# Patient Record
Sex: Male | Born: 1980
Health system: Southern US, Community
[De-identification: ages and names within clinical notes are randomized; demographics above are authoritative.]

## PROBLEM LIST (undated history)

## (undated) DIAGNOSIS — E7800 Pure hypercholesterolemia, unspecified: Secondary | ICD-10-CM

## (undated) DIAGNOSIS — I10 Essential (primary) hypertension: Secondary | ICD-10-CM

## (undated) HISTORY — DX: Pure hypercholesterolemia, unspecified: E78.00

## (undated) HISTORY — PX: TONSILLECTOMY AND ADENOIDECTOMY: SHX28

## (undated) HISTORY — DX: Essential (primary) hypertension: I10

---

## 2009-08-06 HISTORY — PX: LASIK: SHX215

## 2016-01-27 ENCOUNTER — Encounter: Payer: Self-pay | Admitting: Family Medicine

## 2016-06-25 ENCOUNTER — Encounter: Payer: Self-pay | Admitting: Family Medicine

## 2016-07-26 ENCOUNTER — Encounter: Payer: Self-pay | Admitting: Neurology

## 2016-07-26 ENCOUNTER — Ambulatory Visit (INDEPENDENT_AMBULATORY_CARE_PROVIDER_SITE_OTHER): Payer: Managed Care, Other (non HMO) | Admitting: Neurology

## 2016-07-26 VITALS — BP 142/90 | HR 81 | Ht 67.0 in | Wt 191.6 lb

## 2016-07-26 DIAGNOSIS — R42 Dizziness and giddiness: Secondary | ICD-10-CM

## 2016-07-26 DIAGNOSIS — I10 Essential (primary) hypertension: Secondary | ICD-10-CM | POA: Diagnosis not present

## 2016-07-26 MED ORDER — ONDANSETRON 4 MG PO TBDP: ORAL_TABLET | ORAL | 6 refills | Status: DC

## 2016-07-26 NOTE — Patient Instructions (Addendum)
Remember to drink plenty of fluid, eat healthy meals and do not skip any meals. Try to eat protein with a every meal and eat a healthy snack such as fruit or nuts in between meals. Try to keep a regular sleep-wake schedule and try to exercise daily, particularly in the form of walking, 20-30 minutes a day, if you can.    Vestibular therapy, will review MRI brain, zofran as needed  Will call to discuss follow up  Our phone number is [phone]. We also have an after hours call service for urgent matters and there is a physician on-call for urgent questions. For any emergencies you know to call 911 or go to the nearest emergency room  Ondansetron oral dissolving tablet What is this medicine? ONDANSETRON (on DAN se tron) is used to treat nausea and vomiting caused by chemotherapy. It is also used to prevent or treat nausea and vomiting after surgery. This medicine may be used for other purposes; ask your health care provider or pharmacist if you have questions. COMMON BRAND NAME(S): Zofran ODT What should I tell my health care provider before I take this medicine? They need to know if you have any of these conditions: -heart disease -history of irregular heartbeat -liver disease -low levels of magnesium or potassium in the blood -an unusual or allergic reaction to ondansetron, granisetron, other medicines, foods, dyes, or preservatives -pregnant or trying to get pregnant -breast-feeding How should I use this medicine? These tablets are made to dissolve in the mouth. Do not try to push the tablet through the foil backing. With dry hands, peel away the foil backing and gently remove the tablet. Place the tablet in the mouth and allow it to dissolve, then swallow. While you may take these tablets with water, it is not necessary to do so. Talk to your pediatrician regarding the use of this medicine in children. Special care may be needed. Overdosage: If you think you have taken too much of this  medicine contact a poison control center or emergency room at once. NOTE: This medicine is only for you. Do not share this medicine with others. What if I miss a dose? If you miss a dose, take it as soon as you can. If it is almost time for your next dose, take only that dose. Do not take double or extra doses. What may interact with this medicine? Do not take this medicine with any of the following medications: -apomorphine -certain medicines for fungal infections like fluconazole, itraconazole, ketoconazole, posaconazole, voriconazole -cisapride -dofetilide -dronedarone -pimozide -thioridazine -ziprasidone This medicine may also interact with the following medications: -carbamazepine -certain medicines for depression, anxiety, or psychotic disturbances -fentanyl -linezolid -MAOIs like Carbex, Eldepryl, Marplan, Nardil, and Parnate -methylene blue (injected into a vein) -other medicines that prolong the QT interval (cause an abnormal heart rhythm) -phenytoin -rifampicin -tramadol This list may not describe all possible interactions. Give your health care provider a list of all the medicines, herbs, non-prescription drugs, or dietary supplements you use. Also tell them if you smoke, drink alcohol, or use illegal drugs. Some items may interact with your medicine. What should I watch for while using this medicine? Check with your doctor or health care professional as soon as you can if you have any sign of an allergic reaction. What side effects may I notice from receiving this medicine? Side effects that you should report to your doctor or health care professional as soon as possible: -allergic reactions like skin rash, itching or hives, swelling  of the face, lips, or tongue -breathing problems -confusion -dizziness -fast or irregular heartbeat -feeling faint or lightheaded, falls -fever and chills -loss of balance or coordination -seizures -sweating -swelling of the hands and  feet -tightness in the chest -tremors -unusually weak or tired Side effects that usually do not require medical attention (report to your doctor or health care professional if they continue or are bothersome): -constipation or diarrhea -headache This list may not describe all possible side effects. Call your doctor for medical advice about side effects. You may report side effects to FDA at 1-800-FDA-1088. Where should I keep my medicine? Keep out of the reach of children. Store between 2 and 30 degrees C (36 and 86 degrees F). Throw away any unused medicine after the expiration date. NOTE: This sheet is a summary. It may not cover all possible information. If you have questions about this medicine, talk to your doctor, pharmacist, or health care provider.  2017 Elsevier/Gold Standard (2013-04-29 16:21:52)

## 2016-07-26 NOTE — Progress Notes (Signed)
[facility]    Provider:  Dr Lucia GaskinsAhern Referring Provider: [name], Aaron, MD Primary Care Physician:  [name], AARON, MD  CC:  Dizziness  HPI:  Justin Rush is a 35 y.o. male here as a referral from Dr. Kateri PlummerMorrow for dizziness. Past medical history hypertension, high cholesterol. He has constant, continuous dizziness. He was treated with steroids with thoughts it may be labyrinthitis and that did not help. 7-8 weeks ago he started feeling fatigued. Several weeks later the dizziness started and has been continuous for weeks, better in the last week, mornings are better but evenings are worse. Room is not spinning, feels like he is watching an amateur video or walking on a trampoline, when he looks everything is a second behind, or on a boat. He feels like his energy level is low. No CP, SOB, palpitations but he does feel winded and feels his heart rate is increased. He had headaches, no hx of migraines, the headaches are pressure in the back of his head and around his eyes, no light sensitivity, he has ear ringing, no nausea or vomiting, he does get a buzzing in his ears. He feels like he has a blown speaker in his ear. Nothing focal, no paresthesias or focal weakness. No triggers, no previous illnesses, no cold nothing to incite these events. Better in the morning, worse at night. He is very tired, falling asleep at lunch. No snoring. He has had a medical workup an an ENT workup. There were no changes in medication. Here with wife who also provides information,  Reviewed notes, labs and imaging from outside physicians, which showed: Patient is on prednisone 10 mg taper starting at 58 and tapering over 6 days, amlodipine, hydrochlorothiazide, atorvastatin, and topical steroids. The patient was seen at [facility]. He describes distortion of hearing and occasional tinnitus probably related to past noise exposure. He describes a sense of looseness while standing, going back maybe 7 weeks.  Worse later in the day. He has increased fatigue. He feels like his heart rate elevates more rapidly than it used to and he feels winded. He cannot run because everything was up and down. He feels dizzy. He does not describe any true orthostasis or syncope. No cardiac arrhythmias. He is not diabetic. No history of head trauma or whiplash. Sometimes turning his head from side to side causes some symptoms. He was feels some palpation in his ears and behind his eyes he has a diffuse mild headache late in the day. No change in vision or hearing with the dizziness. He does notice that both ears seem a little distorted with loud noise and he has occasional ringing. He was diagnosed dizziness without vertigo. He describes some tachycardia and dyspnea. He has some mild motion related symptoms without true vertigo.  Reviewed testing, pure-tone audiometry revealed excellent hearing in both ears with excellent discrimination. Tympanograms normally side. X Hallpike testing entirely normal. Orthostatic pulse and blood pressure checks normal and without any symptoms with change of position.    BUN 15, creatinine 1.02, TSH 1.3 for 06/25/2016  Review of Systems: Patient complains of symptoms per HPI as well as the following symptoms: Fatigue, headache, dizziness, cramps, allergies, decreased energy, rash. Pertinent negatives per HPI. All others negative.   Social History   Social History  . Marital status: Married    Spouse name: Melissa  . Number of children: 2  . Years of education: BS   Occupational History  . [employer]    Social  History Main Topics  . Smoking status: Never Smoker  . Smokeless tobacco: Never Used  . Alcohol use Yes     Comment: 1 drink per week  . Drug use: No  . Sexual activity: Not on file   Other Topics Concern  . Not on file   Social History Narrative   Lives with spouse 2 children   Caffeine use: none    Family History  Problem Relation Age of Onset  . Stroke  Neg Hx     Past Medical History:  Diagnosis Date  . High cholesterol   . Hypertension     Past Surgical History:  Procedure Laterality Date  . LASIK  2011  . TONSILLECTOMY AND ADENOIDECTOMY  1985/86    Current Outpatient Prescriptions  Medication Sig Dispense Refill  . amLODipine (NORVASC) 5 MG tablet Take 5 mg by mouth daily.    Marland Kitchen atorvastatin (LIPITOR) 10 MG tablet Take 10 mg by mouth daily.    . Cholecalciferol (VITAMIN D3 PO) Take 2,000 Units by mouth daily.    . hydrochlorothiazide (HYDRODIURIL) 25 MG tablet Take 25 mg by mouth daily.    . Multiple Vitamins-Minerals (MULTIVITAMIN MEN PO) Take 1 tablet by mouth daily.    . ondansetron (ZOFRAN ODT) 4 MG disintegrating tablet Take 1 tablet (4 mg total) by mouth every 6 (six) hours as needed 30 tablet 6   No current facility-administered medications for this visit.     Allergies as of 07/26/2016 - never reviewed  Allergen Reaction Noted  . Penicillin g Other (See Comments) 07/05/2016    Vitals: BP (!) 142/90 (BP Location: Right Arm, Patient Position: Sitting, Cuff Size: Normal)   Pulse 81   Ht 5\' 7"  (1.702 m)   Wt 191 lb 9.6 oz (86.9 kg)   BMI 30.01 kg/m  Last Weight:  Wt Readings from Last 1 Encounters:  07/26/16 191 lb 9.6 oz (86.9 kg)   Last Height:   Ht Readings from Last 1 Encounters:  07/26/16 5\' 7"  (1.702 m)   Physical exam: Exam: Gen: NAD, conversant, well nourised, well groomed                     CV: RRR, no MRG. No Carotid Bruits. No peripheral edema, warm, nontender Eyes: Conjunctivae clear without exudates or hemorrhage  Neuro: Detailed Neurologic Exam  Speech:    Speech is normal; fluent and spontaneous with normal comprehension.  Cognition:    The patient is oriented to person, place, and time;     recent and remote memory intact;     language fluent;     normal attention, concentration,     fund of knowledge Cranial Nerves:    The pupils are equal, round, and reactive to light. The  fundi are normal and spontaneous venous pulsations are present. Visual fields are full to finger confrontation. Extraocular movements are intact. Trigeminal sensation is intact and the muscles of mastication are normal. The face is symmetric. The palate elevates in the midline. Hearing intact. Voice is normal. Shoulder shrug is normal. The tongue has normal motion without fasciculations.   Coordination:    Normal finger to nose and heel to shin. Normal rapid alternating movements.   Gait:    Heel-toe and tandem gait are normal.   Motor Observation:    No asymmetry, no atrophy, and no involuntary movements noted. Tone:    Normal muscle tone.    Posture:    Posture is normal. normal erect  Strength:    Strength is V/V in the upper and lower limbs.      Sensation: intact to LT     Reflex Exam:  DTR's:    Deep tendon reflexes in the upper and lower extremities are normal bilaterally.   Toes:    The toes are downgoing bilaterally.   Clonus:    Clonus is absent.       Assessment/Plan:  35 year old with approximately 8 weeks of dizziness without vertigo, continuous. He was already evaluated by ENT without etiology. pure-tone audiometry revealed excellent hearing in both ears with excellent discrimination. Tympanograms normally side. X Hallpike testing entirely normal. Orthostatic pulse and blood pressure checks normal, without any symptoms with change of position. Neurologic exam is normal. He is pending an MRI brain, is having headaches, may consider vestibular migraines as well.   - Please follow up with primary care ensure that an appropriate cardiac workup, as clinically warranted, has been completed. - Vestibular therapy. - MRI of the brain w/wo contrast - zofran for dizziness, could also try meclizine or compazine - he had a thorough workup with Dr. Kateri PlummerMorrow, he requests a lyme test. I think this would be an usual presentation for lyme but will order.  Discussed: To prevent  or relieve headaches, try the following: Cool Compress. Lie down and place a cool compress on your head.  Avoid headache triggers. If certain foods or odors seem to have triggered your migraines in the past, avoid them. A headache diary might help you identify triggers.  Include physical activity in your daily routine. Try a daily walk or other moderate aerobic exercise.  Manage stress. Find healthy ways to cope with the stressors, such as delegating tasks on your to-do list.  Practice relaxation techniques. Try deep breathing, yoga, massage and visualization.  Eat regularly. Eating regularly scheduled meals and maintaining a healthy diet might help prevent headaches. Also, drink plenty of fluids.  Follow a regular sleep schedule. Sleep deprivation might contribute to headaches Consider biofeedback. With this mind-body technique, you learn to control certain bodily functions - such as muscle tension, heart rate and blood pressure - to prevent headaches or reduce headache pain.    Proceed to emergency room if you experience new or worsening symptoms or symptoms do not resolve, if you have new neurologic symptoms or if headache is severe, or for any concerning symptom.  Cc: Dr Grafton FolkMorrow   Junior Kenedy, MD  [facility] [address] [place], [place] [id number]  Phone [phone] Fax [phone]

## 2016-07-27 ENCOUNTER — Other Ambulatory Visit: Payer: Self-pay | Admitting: Family Medicine

## 2016-07-27 DIAGNOSIS — R5383 Other fatigue: Secondary | ICD-10-CM

## 2016-07-27 DIAGNOSIS — R42 Dizziness and giddiness: Secondary | ICD-10-CM

## 2016-07-27 LAB — B. BURGDORFI ANTIBODIES

## 2016-08-01 ENCOUNTER — Telehealth: Payer: Self-pay | Admitting: *Deleted

## 2016-08-01 NOTE — Telephone Encounter (Signed)
Called and spoke to pt about lab results per AA,MD note. He verbalized understanding.  He verified [facility] is [facility]. Advised we will call with results as soon as those come back.

## 2016-08-01 NOTE — Telephone Encounter (Signed)
-----   Message from Anson FretAntonia B Ahern, MD sent at 07/28/2016 12:02 PM EST ----- Lyme normal thanks

## 2016-08-07 ENCOUNTER — Ambulatory Visit
Admission: RE | Admit: 2016-08-07 | Discharge: 2016-08-07 | Disposition: A | Discharge status: Home / Self Care | Payer: Self-pay | Source: Ambulatory Visit | Attending: Family Medicine | Admitting: Family Medicine

## 2016-08-07 DIAGNOSIS — R5383 Other fatigue: Secondary | ICD-10-CM

## 2016-08-07 DIAGNOSIS — R42 Dizziness and giddiness: Secondary | ICD-10-CM

## 2016-08-07 MED ORDER — GADOBENATE DIMEGLUMINE 529 MG/ML IV SOLN
17.0000 mL | Freq: Once | INTRAVENOUS | Status: AC | PRN
  Administered 2016-08-07: 17 mL via INTRAVENOUS

## 2016-08-09 NOTE — Telephone Encounter (Signed)
MRI normal. Incidental Retention cyst RIGHT maxillary sinus which is often times asymptomatic but if he has sinus pressure or pain he should follow up with pcp or ent for evaluation. Thanks.

## 2016-08-09 NOTE — Telephone Encounter (Signed)
Pt called request MRI results °

## 2016-08-09 NOTE — Telephone Encounter (Signed)
Called and spoke to pt about MRI results per AA,MD note. He verbalized understanding. He has first physical therapy appt tomorrow for initial consult. He will call back if he has further questions/concerns.

## 2016-08-09 NOTE — Telephone Encounter (Signed)
Called and spoke to pt. Advised results not ready yet. Can take up to a week to come back. We will call once ready. He verbalized understanding.

## 2016-08-10 ENCOUNTER — Ambulatory Visit
Payer: Managed Care, Other (non HMO) | Attending: Neurology | Admitting: Rehabilitative and Restorative Service Providers"

## 2016-08-10 DIAGNOSIS — R42 Dizziness and giddiness: Secondary | ICD-10-CM

## 2016-08-10 NOTE — Therapy (Signed)
[facility] [address] [place], [place], [address] Phone: [phone]   Fax:  [phone]  Physical Therapy Evaluation  Patient Details  Name: Justin Rush MRN: [id number] Date of Birth: 15-Feb-1981 Referring Provider: Anda Kraft, MD  Encounter Date: 08/10/2016      PT End of Session - 08/10/16 1232    Visit Number 1   Number of Visits 8   Date for PT Re-Evaluation 10/09/16   Authorization Type [employer]   PT Start Time 0845   PT Stop Time 0932   PT Time Calculation (min) 47 min   Activity Tolerance Patient tolerated treatment well   Behavior During Therapy [facility] for tasks assessed/performed      Past Medical History:  Diagnosis Date  . High cholesterol   . Hypertension     Past Surgical History:  Procedure Laterality Date  . LASIK  2011  . TONSILLECTOMY AND ADENOIDECTOMY  1985/86    There were no vitals filed for this visit.       Subjective Assessment - 08/10/16 0928    Subjective The patient began with severe fatigue 2 months ago x 2 weeks followed by onset of dizziness.  He denies sensation of room spinning, and describes visual movement stating "you turn and the screen moves with you".   Symptoms are improved with sitting still and aggravated by physical activity/movement.  He notes intermittent headaches, not severe in nature.  Symptoms are worse later in the day.  Fatigue continues, worse in the afternoon.  He notes a buzzing sensation when exposed to loud noise and an aural fullness that is intermittent in nature.  No h/o vertigo or visual deficits.  "I can hear my hearbeat in my ears."     Patient Stated Goals "Hoping to get rid of this" and get back to normal.    Currently in Pain? No/denies            [facility] PT Assessment - 08/10/16 0935      Assessment   Medical Diagnosis Dizziness   Referring Provider Anda Kraft, MD   Onset Date/Surgical Date --  05/2016   Prior Therapy none     Precautions   Precautions None     Restrictions   Weight Bearing Restrictions No     Balance Screen   Has the patient fallen in the past 6 months No   Has the patient had a decrease in activity level because of a fear of falling?  Yes  due to symptoms   Is the patient reluctant to leave their home because of a fear of falling?  No     Home Tourist information centre manager residence     Prior Function   Level of Independence Independent   Vocation Full time employment   Vocation Requirements seated at desk, meetings (noting head motion difficult)   Leisure working out (unable at this time due to symptoms).     Observation/Other Assessments   Focus on Therapeutic Outcomes (FOTO)  78%   Other Surveys  Other Surveys   Dizziness Handicap Inventory [facility])  30%     Sensation   Light Touch Appears Intact     Posture/Postural Control   Posture/Postural Control No significant limitations            Vestibular Assessment - 08/10/16 0938      Vestibular Assessment   General Observation Patient ambulates independently into clinic     Symptom Behavior   Type of  Dizziness Oscillopsia   Frequency of Dizziness daily   Duration of Dizziness notes every time he walks or moves   Aggravating Factors Turning head quickly;Activity in general  walking, and worse with fatigue/afternoons   Relieving Factors Head stationary     Occulomotor Exam   Occulomotor Alignment Normal   Spontaneous Absent   Gaze-induced Absent   Head shaking Horizontal Absent   Smooth Pursuits Intact   Saccades Intact   Comment Observed eyes in room light and with frenzel lenses donned.  Patient did not have visible nystagmus during evaluation.     Vestibulo-Occular Reflex   VOR 1 Head Only (x 1 viewing) Experiences mild sensation of visual movement with self regulated head motion.  Head impulse test is positive to both sides for refixation saccade   VOR Cancellation Normal     Visual Acuity    Static line 10 with both eyes open   Dynamic line 3 with 7 line difference indicating bilateral defect in VOR     Other Tests   Tragal Tragal pressure testing with frenzels donned negative   Hyperventilation negative for nystagmus     Positional Testing   Dix-Hallpike --  deferred due to has been done and no indication for BPPV     Production designer, theatre/television/film organization testing is WNLs compared to age/height normative values.  He had below normal performance on trials 1-2 of condition 4, and then corrected for support surface movement.                 Vestibular Treatment/Exercise - 08/10/16 2239      Vestibular Treatment/Exercise   Vestibular Treatment Provided Gaze   Gaze Exercises X1 Viewing Horizontal;X1 Viewing Vertical     X1 Viewing Horizontal   Foot Position standing feet apart   Comments x 30 seconds with demonstration and verbal cues on speed, range of motion, and goal of exercise     X1 Viewing Vertical   Foot Position repeated vertical gaze adaptation with feet apart as above.               PT Education - 08/10/16 1016    Education provided Yes   Education Details HEP: VOR x 1 gaze adaptation standing feet apart   Person(s) Educated Patient   Methods Explanation;Demonstration;Handout   Comprehension Verbalized understanding;Returned demonstration          PT Short Term Goals - 08/10/16 2241      PT SHORT TERM GOAL #1   Title STGs=LTGs           PT Long Term Goals - 08/10/16 2241      PT LONG TERM GOAL #1   Title The patient will be independent with gaze adaptation HEP to address VOR deficits.   Baseline Target date 09/10/2016   Time 4   Period Weeks     PT LONG TERM GOAL #2   Title The patient will reduce DHI from 30% to < or equal to 18% to demo dec'd self perception of dizziness.   Baseline New target date 09/10/2016   Time 4   Period Weeks     PT LONG TERM GOAL #3    Title The patient will return to modified community wellness program.   Baseline New target date 09/10/2016   Time 4   Period Weeks               Plan - 08/10/16 2249  Clinical Impression Statement The patient is a [age] year old male presenting to OP PT with 2+ month h/o fatigue and visual oscillopsia. He presents with positive head impulse test bilaterally and 7 line difference on static versus dynamic visual acuity indicating significant VOR impairment bilaterally.   In addition to oscillopsia, he is also experiencing audiologic symptoms of sensitivity to drums at church (creates a tunneling sensation worse in L ear), hearing his own heartbeat in his ears.  The patient may benefit from further medical testing due to audiologic symtpoms.   Rehab Potential Good   PT Frequency 1x / week   PT Duration 4 weeks   PT Treatment/Interventions ADLs/Self Care Home Management;Therapeutic activities;Therapeutic exercise;Balance training;Neuromuscular re-education;Patient/family education;Gait training;Vestibular   PT Next Visit Plan Check progress on gaze adaptation exercises.  Discuss return to gym with PT modifying current plan to improve tolerance.   Consulted and Agree with Plan of Care Patient      Patient will benefit from skilled therapeutic intervention in order to improve the following deficits and impairments:  Decreased activity tolerance, Dizziness, Impaired vision/preception  Visit Diagnosis: Dizziness and giddiness     Problem List Patient Active Problem List   Diagnosis Date Noted  . Dizziness 07/26/2016  . HTN (hypertension) 07/26/2016    Yer Castello 08/10/2016, 11:02 PM  [facility] [address] [place], [place], [address] Phone: [phone]   Fax:  [phone]  Name: Joanie CoddingtonDaniel Brisco MRN: [id number] Date of Birth: 1981/07/17

## 2016-08-10 NOTE — Patient Instructions (Signed)
Gaze Stabilization - Tip Card  1.Target must remain in focus, not blurry, and appear stationary while head is in motion. 2.Perform exercises with small head movements (45 to either side of midline). 3.Increase speed of head motion so long as target is in focus. 4.If you wear eyeglasses, be sure you can see target through lens (therapist will give specific instructions for bifocal / progressive lenses). 5.These exercises may provoke dizziness or nausea. Work through these symptoms. If too dizzy, slow head movement slightly. Rest between each exercise. 6.Exercises demand concentration; avoid distractions. 7.For safety, perform standing exercises close to a counter, wall, corner, or next to someone.  Copyright  VHI. All rights reserved.   Gaze Stabilization - Standing Feet Apart   Feet shoulder width apart, keeping eyes on target on wall 3 feet away, tilt head down slightly and move head side to side for 30 seconds. Repeat while moving head up and down for 30 seconds. *Work up to tolerating 60 seconds, as able. Do 3 sessions per day.   Copyright  VHI. All rights reserved.    

## 2016-08-24 ENCOUNTER — Ambulatory Visit: Payer: Managed Care, Other (non HMO) | Admitting: Rehabilitative and Restorative Service Providers"

## 2016-08-24 DIAGNOSIS — R42 Dizziness and giddiness: Secondary | ICD-10-CM

## 2016-08-24 NOTE — Therapy (Signed)
[facility] [address] [place], [place], [address] Phone: [phone]   Fax:  [phone]  Physical Therapy Treatment  Patient Details  Name: Justin Rush MRN: [id number] Date of Birth: 12-18-1980 Referring Provider: Heide Spark, MD  Encounter Date: 08/24/2016      PT End of Session - 08/24/16 0945    Visit Number 2   Number of Visits 8   Date for PT Re-Evaluation 10/09/16   Authorization Type [employer]   PT Start Time [id number]   PT Stop Time 0930   PT Time Calculation (min) 40 min   Activity Tolerance Patient tolerated treatment well   Behavior During Therapy [facility] for tasks assessed/performed      Past Medical History:  Diagnosis Date  . High cholesterol   . Hypertension     Past Surgical History:  Procedure Laterality Date  . LASIK  2011  . TONSILLECTOMY AND ADENOIDECTOMY  1985/86    There were no vitals filed for this visit.      Subjective Assessment - 08/24/16 0850    Subjective The exercises "feel awkward".  He notes an improvement in symptoms reporting no longer constant.  He still notes symptoms every day with sporadic symptoms not associated with any specific movement.  He hears heartbeat in his ear when quiet and still notes "speaker blown" sensation in his left ear (it is tone dependent).     Patient Stated Goals "Hoping to get rid of this" and get back to normal.    Currently in Pain? No/denies                         [facility] Adult PT Treatment/Exercise - 08/24/16 1001      Self-Care   Self-Care Other Self-Care Comments   Other Self-Care Comments  Discussed modification of jogging routine to interval training; also discussed variables for safe ways to progress exercises, cardio routine and strengthening activities.  Provided guidance via written cues on HEP.          Vestibular Treatment/Exercise - 08/24/16 0958      Vestibular Treatment/Exercise   Vestibular Treatment  Provided Habituation;Gaze   Habituation Exercises Standing Horizontal Head Turns   Gaze Exercises X1 Viewing Horizontal;X1 Viewing Vertical     Standing Horizontal Head Turns   Number of Reps  10   Symptom Description  Initially began with 10 reps without visual fixation while standingon compliant surfaces.  Patient noted moderate to severe symptom provocation.  Therefore, reduced speed of head motion and repetitions to 5x for HEP.  Provokes mild to moderate symtpoms.      X1 Viewing Horizontal   Foot Position standing feet apart and progressed to standing partial heel toe and then tandem.   Comments Performed x 45 seconds with feet apart and 30 seconds with tandem foot position.     X1 Viewing Vertical   Foot Position standing feet apart   Comments Progressed to 45 seconds with less symptoms than in horizontal plane.               PT Education - 08/24/16 0944    Education provided Yes   Education Details HEP: VOR x 1 viewing feet apart for speed, VOR x 1 viewing tandem stance, foam stand with head motion, and walking program   Person(s) Educated Patient   Methods Explanation;Demonstration;Handout   Comprehension Verbalized understanding;Returned demonstration          PT Short Term Goals -  08/10/16 2241      PT SHORT TERM GOAL #1   Title STGs=LTGs           PT Long Term Goals - 08/24/16 0946      PT LONG TERM GOAL #1   Title The patient will be independent with gaze adaptation HEP to address VOR deficits.   Baseline Met on 08/24/2016   Time 4   Period Weeks   Status Achieved     PT LONG TERM GOAL #2   Title The patient will reduce DHI from 30% to < or equal to 18% to demo dec'd self perception of dizziness.   Baseline New target date 09/10/2016   Time 4   Period Weeks     PT LONG TERM GOAL #3   Title The patient will return to modified community wellness program.   Baseline New target date 09/10/2016   Time 4   Period Weeks               Plan -  08/24/16 7412    Clinical Impression Statement The patient is progressing with symptoms overall noting reduced frequency of symptoms.  PT session focused on modifying current home program in order to challenge gaze exercises, head motion for habituation, and progressing wellness routine for improving tolerance to activities.    PT Treatment/Interventions ADLs/Self Care Home Management;Therapeutic activities;Therapeutic exercise;Balance training;Neuromuscular re-education;Patient/family education;Gait training;Vestibular   PT Next Visit Plan Check HEP, discuss progression of wellness routine, long term goal check.   Consulted and Agree with Plan of Care Patient      Patient will benefit from skilled therapeutic intervention in order to improve the following deficits and impairments:  Decreased activity tolerance, Dizziness, Impaired vision/preception  Visit Diagnosis: Dizziness and giddiness     Problem List Patient Active Problem List   Diagnosis Date Noted  . Dizziness 07/26/2016  . HTN (hypertension) 07/26/2016    Arbadella Kimbler, PT 08/24/2016, 10:02 AM  [facility] [address] [place], [place], [address] Phone: [phone]   Fax:  [phone]  Name: Justin Rush MRN: [id number] Date of Birth: 26-Mar-1981

## 2016-08-24 NOTE — Patient Instructions (Signed)
Gaze Stabilization - Tip Card  1.Target must remain in focus, not blurry, and appear stationary while head is in motion. 2.Perform exercises with small head movements (45 to either side of midline). 3.Increase speed of head motion so long as target is in focus. 4.If you wear eyeglasses, be sure you can see target through lens (therapist will give specific instructions for bifocal / progressive lenses). 5.These exercises may provoke dizziness or nausea. Work through these symptoms. If too dizzy, slow head movement slightly. Rest between each exercise. 6.Exercises demand concentration; avoid distractions. 7.For safety, perform standing exercises close to a counter, wall, corner, or next to someone.  Copyright  VHI. All rights reserved.   Gaze Stabilization - Standing Feet Apart   Feet shoulder width apart, keeping eyes on target on wall 3 feet away, tilt head down slightly and move head side to side for 45-60 seconds. Repeat while moving head up and down for 45-60 seconds. Do 2-3 sessions per day.   Copyright  VHI. All rights reserved.   Gaze Stabilization: Standing Feet Heel-Toe "Tandem"    Feet in full heel-toe position, keeping eyes on target on wall __3__ feet away, tilt head down 15-30 and move head side to side for __30__ seconds. Switch feet and repeat side to side for 30 seconds. Do __2__ sessions per day.  Copyright  VHI. All rights reserved.   Feet Together (Compliant Surface) Head Motion - Eyes Open    With eyes open, standing on compliant surface: ___pillow_____, feet together, move head slowly:  Side to side. Repeat __5__ times *at a slow speed to begin* , repeat for a total of 3 sets letting symptoms settle between sets. Do __2__ sessions per day.  Copyright  VHI. All rights reserved.   RETURN TO JOGGING AND EXERCISE: 1) Interval training: Jogging on level surface during daylight x 1-2 minute intervals/ walk for 4-5 minutes to let symptoms settle.  Increase  jogging distance/time as tolerable. Then would focus on inclines/hilly areas. Returning to [employer] ground with jogging.  Strengthening/core: 1) Blocked practice:  All push-ups, then sit-ups.  *avoid quick changes in body position.   Special Instructions: Exercises may bring on mild to moderate symptoms (5/10 max)  of visual blurring that resolve within 10 minutes of completing exercises. If symptoms are lasting longer than 10 minutes, modify your exercises by:  >decreasing the # of times you complete each activity >ensuring your symptoms return to baseline before moving onto the next exercise >dividing up exercises so you do not do them all in one session, but multiple short sessions throughout the day >doing them once a day until symptoms improve

## 2016-08-26 ENCOUNTER — Telehealth: Payer: Self-pay | Admitting: Neurology

## 2016-08-26 NOTE — Telephone Encounter (Signed)
Kara MeadEmma, I received this message from Jackelyn Hoehnhristine Weaver and she thinks patient should be evaluated for a specific condition. I would refer him to ENT to be evaluated for this. Would you call patient and see if he is willing to go to ENT and if he has seen any ENTs in the past? If not, we will refer him to Dr. Haroldine Lawsrossley thanks    Mr. Delsa SaleCarper returned today for treatment session (first visit after evaluation). He is noting improvement with symptoms of dizziness and is responding well to gaze adaptation exercises and PT has modified his wellness routine to encourage return to prior status. PT plans to see in 3 weeks for f/u and to progress current home program.  He continues with complaints of "speaker blown" in his left ear when in church with loud music, as well as hearing his own heartbeat in his ears. These are auditory symptoms that will not be addressed by PT scope. He may benefit from further testing to r/o superior canal dehiscence due to his specific c/o auditory changes.   Thank you,  WEAVER,CHRISTINA, PT

## 2016-08-27 NOTE — Telephone Encounter (Signed)
Called and LVM for pt to call back. Relayed AA,MD note below. Asked him to call back and let us know how he would like to proceed. Gave [facility] phone number.

## 2016-08-29 ENCOUNTER — Telehealth: Payer: Self-pay | Admitting: *Deleted

## 2016-08-29 NOTE — Telephone Encounter (Signed)
Pt returned your call.  

## 2016-08-29 NOTE — Telephone Encounter (Signed)
He should call his ENT to discuss with his doctor. His physical therapist thought he may have superior canal dehiscence syndrome. I am sure his ENT would have caught that but it does not hurt to let them know that physical therapy inquired if this was a possibility thanks

## 2016-08-29 NOTE — Telephone Encounter (Signed)
Called patient back. He was already evaluated by ENT prior to coming to see Dr Lucia GaskinsAhern. He saw Jori MollKarl Wolicki, ENT on 07/23/16 (PCP referred him to ENT) He is wondering if he should go back to him to be evaluated or come back to our office since he has already got to ENT. Advised Dr Lucia GaskinsAhern not here but I will send message. She is answering phone calls. I will call him back once I receive an answer. He verbalized understanding.

## 2016-08-29 NOTE — Telephone Encounter (Signed)
Per  Vassie MomentLinda S McIntosh 1 minute ago (2:38 PM)      Pt returned your call

## 2016-08-29 NOTE — Telephone Encounter (Signed)
See other phone note

## 2016-08-30 NOTE — Telephone Encounter (Signed)
Called and spoke to pt. Relayed Dr Trevor MaceAhern's message below. He will contact his ENT to be evaluated. He will call back if he has any further questions or concerns.

## 2016-09-07 ENCOUNTER — Ambulatory Visit: Payer: Managed Care, Other (non HMO) | Admitting: Rehabilitative and Restorative Service Providers"

## 2016-09-21 ENCOUNTER — Ambulatory Visit: Payer: Managed Care, Other (non HMO) | Admitting: Rehabilitative and Restorative Service Providers"

## 2016-10-04 ENCOUNTER — Ambulatory Visit
Payer: Managed Care, Other (non HMO) | Attending: Neurology | Admitting: Rehabilitative and Restorative Service Providers"

## 2016-10-04 DIAGNOSIS — R42 Dizziness and giddiness: Secondary | ICD-10-CM | POA: Diagnosis present

## 2016-10-04 NOTE — Patient Instructions (Signed)
Gaze Stabilization - Tip Card  1.Target must remain in focus, not blurry, and appear stationary while head is in motion. 2.Perform exercises with small head movements (45 to either side of midline). 3.Increase speed of head motion so long as target is in focus. 4.If you wear eyeglasses, be sure you can see target through lens (therapist will give specific instructions for bifocal / progressive lenses). 5.These exercises may provoke dizziness or nausea. Work through these symptoms. If too dizzy, slow head movement slightly. Rest between each exercise. 6.Exercises demand concentration; avoid distractions. 7.For safety, perform standing exercises close to a counter, wall, corner, or next to someone.  Copyright  VHI. All rights reserved.   Gaze Stabilization - Standing Feet Apart   Feet shoulder width apart, keeping eyes on target on wall 3 feet away, tilt head down slightly and move head side to side for 45-60 seconds. Repeat while moving head up and down for 45-60 seconds. Do 2-3 sessions per day.   Copyright  VHI. All rights reserved.   Gaze Stabilization: Standing Feet Heel-Toe "Tandem"    Feet in full heel-toe position, keeping eyes on target on wall __3__ feet away, tilt head down 15-30 and move head side to side for __30__ seconds. Switch feet and repeat side to side for 30 seconds. Do __2__ sessions per day.  Copyright  VHI. All rights reserved.   Lateral Medicine Ball Toss    Standing on one leg, toss ball over one shoulder and catch over the other.   Repeat 10 times tossing over each side.   START WITH BOTH FEET APART, then progress to standing on pillow/compliant surface, then progress to single limb if needing more challenge.    http://bt.exer.us/72   Copyright  VHI. All rights reserved.    RETURN TO JOGGING AND EXERCISE: 1) Interval training: Jogging on level surface during daylight x 1-2 minute intervals/ walk for 4-5 minutes to let symptoms settle.    Increase jogging distance/time as tolerable. Then would focus on inclines/hilly areas. Returning to [employer] ground with jogging.  Strengthening/core: 1) Blocked practice:  All push-ups, then sit-ups.  *avoid quick changes in body position.   Special Instructions: Exercises may bring on mild to moderate symptoms (5/10 max)  of visual blurring that resolve within 10 minutes of completing exercises. If symptoms are lasting longer than 10 minutes, modify your exercises by:  >decreasing the # of times you complete each activity >ensuring your symptoms return to baseline before moving onto the next exercise >dividing up exercises so you do not do them all in one session, but multiple short sessions throughout the day >doing them once a day until symptoms improve

## 2016-10-04 NOTE — Therapy (Signed)
[facility] [address] [place], [place], [address] Phone: [phone]   Fax:  [phone]  Physical Therapy Treatment  Patient Details  Name: Justin Rush MRN: [id number] Date of Birth: Oct 31, 1980 Referring Provider: Heide Spark, MD  Encounter Date: 10/04/2016      PT End of Session - 10/04/16 1410    Visit Number 3   Number of Visits 8   Date for PT Re-Evaluation 10/09/16   Authorization Type [employer]   PT Start Time 1020   PT Stop Time 1100   PT Time Calculation (min) 40 min   Activity Tolerance Patient tolerated treatment well   Behavior During Therapy [facility] for tasks assessed/performed      Past Medical History:  Diagnosis Date  . High cholesterol   . Hypertension     Past Surgical History:  Procedure Laterality Date  . LASIK  2011  . TONSILLECTOMY AND ADENOIDECTOMY  1985/86    There were no vitals filed for this visit.      Subjective Assessment - 10/04/16 1021    Subjective The patient notes that symptoms improved significantly and he had stopped doing exercises regularly (estimated >50% improvement)- noted episodes a few times/day.  He slowed in doing exercises and notes worsening symptoms since not being as consistent.  He feels symptoms are worse in morning and in evening he notes fatigue, but not to the level of original symtpoms.    He notes some HA and pressure in his head worse when fatigued.   Patient notes that at times he feels his eyes move in beat with his pulse.  He expresses concern regarding intermittent nature of symptoms.   Patient Stated Goals "Hoping to get rid of this" and get back to normal.    Currently in Pain? No/denies                         [facility] Adult PT Treatment/Exercise - 10/04/16 1415      Ambulation/Gait   Ambulation/Gait Yes   Gait Comments Dynamic gait activities emphasizing head motion with eye/hand coordination performing ball toss R to/from L.        Self-Care   Self-Care Other Self-Care Comments   Other Self-Care Comments  PT and patient discussed at length intermittent audiologic symptoms.  PT explained role of rehab is not to diagnose, however due to intermittent symptoms and pulsatile sensation of heartbeat in ears, he may benefit from further assessment to r/o superior canal dehiscence.  Patient inquired about ENTs in this area to get a second opinion from initial ENT consult.  PT provided Oletta Cohn, MD name at [facility].      Neuro Re-ed    Neuro Re-ed Details  performed standing ball toss over R shoulder/catch over L and reverse x 10 reps.  Added to HEP and discussed progression.         Vestibular Treatment/Exercise - 10/04/16 1417      Vestibular Treatment/Exercise   Vestibular Treatment Provided Gaze     X1 Viewing Horizontal   Foot Position standing tandem and also feet apart   Comments Performed x 30 seconds with dizziness 3-4/10 today noting "today is not a good day"               PT Education - 10/04/16 1410    Education provided Yes   Education Details HEP: VOR progression, ball toss, and return to prior activity progression   Person(s) Educated Patient  Methods Explanation;Demonstration;Handout   Comprehension Verbalized understanding;Returned demonstration          PT Short Term Goals - 08/10/16 2241      PT SHORT TERM GOAL #1   Title STGs=LTGs           PT Long Term Goals - 10/04/16 1411      PT LONG TERM GOAL #1   Title The patient will be independent with gaze adaptation HEP to address VOR deficits.   Baseline Met on 08/24/2016   Time 4   Period Weeks   Status Achieved     PT LONG TERM GOAL #2   Title The patient will reduce DHI from 30% to < or equal to 18% to demo dec'd self perception of dizziness.   Baseline Patient having intermittent symptoms.   Time 4   Period Weeks   Status Deferred     PT LONG TERM GOAL #3   Title The patient will return to modified community  wellness program.   Baseline Met on 10/04/2016   Time 4   Period Weeks   Status Achieved               Plan - 10/04/16 1412    Clinical Impression Statement The patient met 2/3 LTGs (dizziness handicap index not administered at d/c visit today).  He was making progress and then, in the past week noted an increase in symtpoms again with audiologic symptoms (pulsatile sensation of dizziness in sync with heartbeat).  PT recommended he pursue f/u with ENT.  He inquired about other ENTs in this area.   PT Treatment/Interventions ADLs/Self Care Home Management;Therapeutic activities;Therapeutic exercise;Balance training;Neuromuscular re-education;Patient/family education;Gait training;Vestibular   PT Next Visit Plan Discharge   Consulted and Agree with Plan of Care Patient      Patient will benefit from skilled therapeutic intervention in order to improve the following deficits and impairments:  Decreased activity tolerance, Dizziness, Impaired vision/preception  Visit Diagnosis: Dizziness and giddiness    PHYSICAL THERAPY DISCHARGE SUMMARY  Visits from Start of Care: 3  Current functional level related to goals / functional outcomes: See goals above   Remaining deficits: Audiologic symptoms of pulsatile heartbeat, pulsing sensation in beat with drums at church, pressure, fatigue and intermittent dizziness that fluctuates.    Education / Equipment: HEP for dizziness, education that PT does not address audiologic symptoms and recommendation for further assessment.  Plan: Patient agrees to discharge.  Patient goals were partially met. Patient is being discharged due to meeting the stated rehab goals.  ?????        Thank you for the referral of this patient. Rudell Cobb, MPT  [place], PT 10/04/2016, 2:21 PM  [facility] [address] [place], [place], [address] Phone: [phone]   Fax:   [phone]  Name: Justin Rush MRN: [id number] Date of Birth: 1980-09-20

## 2017-08-15 ENCOUNTER — Inpatient Hospital Stay: Payer: Commercial Managed Care - Pharmacy Benefit Manager

## 2017-08-15 DIAGNOSIS — Z719 Counseling, unspecified: Secondary | ICD-10-CM

## 2017-08-21 ENCOUNTER — Telehealth: Payer: Commercial Managed Care - Pharmacy Benefit Manager

## 2017-08-22 ENCOUNTER — Ambulatory Visit: Payer: PRIVATE HEALTH INSURANCE

## 2017-08-22 DIAGNOSIS — H838X9 Other specified diseases of inner ear, unspecified ear: Secondary | ICD-10-CM

## 2017-08-23 ENCOUNTER — Telehealth: Payer: PRIVATE HEALTH INSURANCE

## 2017-08-24 NOTE — Telephone Encounter
Hello,    Please provide a consultation note for th Zoom conference for [facility] on 08/21/17. This will be used for surgery authorization .    Thank you.   Dani

## 2017-08-29 ENCOUNTER — Ambulatory Visit: Payer: Commercial Managed Care - Pharmacy Benefit Manager

## 2017-08-29 DIAGNOSIS — H838X3 Other specified diseases of inner ear, bilateral: Secondary | ICD-10-CM

## 2017-09-04 ENCOUNTER — Ambulatory Visit: Payer: PRIVATE HEALTH INSURANCE

## 2017-09-06 DIAGNOSIS — H838X3 Other specified diseases of inner ear, bilateral: Secondary | ICD-10-CM

## 2017-09-06 DIAGNOSIS — H93A9 Pulsatile tinnitus, unspecified ear: Secondary | ICD-10-CM

## 2017-09-06 DIAGNOSIS — R2689 Other abnormalities of gait and mobility: Secondary | ICD-10-CM

## 2017-09-06 DIAGNOSIS — H900 Conductive hearing loss, bilateral: Secondary | ICD-10-CM

## 2017-09-06 DIAGNOSIS — R42 Dizziness and giddiness: Secondary | ICD-10-CM

## 2017-09-07 NOTE — H&P
PATIENT: Kevin Peterson AGE                                                                  MRN: [id number]  DOB:  08/29/1980        Referring Provider: No ref. provider found  Primary Care Provider: Farris Has    I had the pleasure of seeing Kevin Peterson today in Neurosurgical consultation.    History:     CC: B/L SSCD     History of Present Illness: Kevin Peterson is a 36 y.o. male w/ h/o B/L SSCD diagnosed in 2017, with symptoms reportedly beginning in [place] of 2017. He also comes with outside CT Temporal Bones and VEMP testing for review. His outside shows bilateral conductive hearing loss, though the patient reports that he is hearing well overall. His symptoms include pulsatile tinnitus, internal amplification of footfalls, vertigo, dizziness, and autophony. No other neuro-otologic symptoms or complaints at this time.        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    LEFT: clear dehiscence of superior semicircular canal     Prior Testing: outside CT Temporal Bones   Prior Ear Surgery: none    SSCD Symptom Summary:  Auditory Symptoms:  Endorses/Denies   Autophony Yes   Hyperacusis No   Hearing loss Yes   Internal amplification of sound Yes   Tinnitus aurium  No   Pulsatile tinnitus  Yes     Vestibular Symptoms:  Endorses/Denies   Dizziness  Yes   Noise-induced dizziness No   Vertigo Yes   Balance disequilibrium No   Oscillopsia No     Other Symptoms: Endorses/Denies   Aural fullness No   Brain Fog No   Fatigue No   Headache No   Head Pressure  No   Migraines No         Past Medical History: No past medical history on file.     Past Surgical History: No past surgical history on file.    Current Medications: No current outpatient prescriptions on file. Allergies: Patient has no allergy information on record.    Social History:   Social History   Substance Use Topics   ??? Smoking status: Not on file   ??? Smokeless tobacco: Not on file   ??? Alcohol use Not on file        Family History: No family history on file.    Review of Systems (14-point ROS): The patient was asked and responded to a 14 point review of systems regarding constitutional symptoms, eye symptoms, ears, nose, mouth, throat symptoms, cardiovascular symptoms, respiratory symptoms, gastrointestinal symptoms, genitourinary symptoms, musculoskeletal symptoms, integumentary symptoms, neurological symptoms, psychiatric symptoms, endocrine symptoms, hematologic/lymphatic symptoms, and allergic/ immunologic symptoms. The written questionnaire was reviewed, pertenent factors have been included in the HPI, and the complete list can be found in the patient's medical chart.      Physical Examination:   The patient underwent a physical examination as described below. The pertinent positive and negative findings are summarized after  the description of the examination.    Constitutional ~The patient's developmental and nutritional status was assessed.   ~The patient's ability to comminicate was assesed.   Head and Face ~The head and face were inspected for deformities.   ~Facial strength was assessed bilaterally.   Eyes ~Extraocular movements and primary gaze alignment were assessed.   Ears, Nose, Mouth &Throat ~The pinnae were examined.    ~The lips, teeth, and gums were examined for abnormalities.    Neck ~ The tracheal position was noted, and the neck was visually inspected to assess for gross asymmetries, abnormal neck masses, or thyromegally.   Respiratory ~The nature of the breathing and chest expansion/symmetry was observed.   Cardiovascular ~The patient was examined to determine the presence of any edema or jugular venous distension.   Abdomen ~The contour of the abdomen was noted. Lymphatic ~The patient was examined for apparent lymphedema of the extremeties.   Musculoskeletal ~The patient was inspected for the presence of skeletal deformities.   Extremities ~The extremities were examined for any clubbing or cyanosis.   Skin ~The skin was examined for inflammatory or neoplastic conditions.   Neurologic ~The patient's orientation, mood, and affect were noted.   ~The cranial nerve  functions were examined(II-VII, IX-XII).     Pertinent findings on physical examination includes:    Alert and oriented x3    Face symetric  Tongue: midline    Following commands x4    No pronator drift   Ambulates without assistance     Pleasant    Sensation grossly intact   All other physical examination findings were noncontributory.      Review of Clinical Data:   I spend a significant amount of time reviewing the patients most recent clinic testing and discussed these findings.     Radiology:     > Outside CT Temporal Bones dated 08/15/2017 reviewed in clinic   B/L SSCD     Vestibular Testing:     > Outside VEMP testing dated on 08/12/2017 was reviewed and noted below:  Vestibular Clinical Testing Laboratory  Patient Name: Kevin Peterson.  Gender: male Age: 37 y.o.  Medical Record Number: [id number] Date of Eval: 08/12/2017  Referring Physician: Tommy Medal  Operator: Dutch Gray Clemons  Indications for vestibular testing: Dizziness and giddiness ? R42  Vestibular Evoked Myogenic Potentials ?VEMPs?  Cervical VEMP Results  500 Hz tone burst, 125 dB SPL ?Normal range for normalized response is 1.3?3.0?  Left ear Right ear  cVEMP response amplitude ?microvolts? 307.68 342.71  Rectified EMG ?underlying muscle activation, microvolts? 127 114  Normalized response ?unitless? 2.42 3.00  Percent asymmetry 10.75  Ocular VEMP Results  500 Hz tone bursts ?Normal response range is 0?17 microvolts?  Left ear Right ear  oVEMP response amplitude ?microvolts? 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  Clinical correlation is recommended for all abnormal results. Additional information regarding interpretation of VEMP studies is at the bottom of this report.  ???????? Notes on the Vestibular Test Battery ????????  VEMP Testing notes:  Vestibular evoked myogenic potentials ?VEMPs? are a family of auditory evoked potentials or mechanically evoked potentials that indicate short?latency responses from  the otolith organs of the inner ear. Ocular VEMPs likely represent utricular function, while sound?evoked cervical VEMPS represent saccular function. Both are subject to  losses with age and with particular vestibular disorders. Testing of ocular and cervical vestibular?evoked myogenic potentials ?o?  and cVEMPs? in response to either: a.  500?Hz short tone bursts ?1 ms rise/fall time, 2 ms plateau; 125 dBSPL?; b. 0.1 ms clicks ?95 dB nHL?; c. manual reflex hammer taps; or d. mini taps with a Bruel and Kjaer  Mini?Shaker Type 4810, were performed with surface electrodes overlying the left and right inferior oblique extraocular muscles for ocular recording and using surface  electrodes overlying the left and right the sternocleidomastoid muscles using an [employer] Chartr EP system ?version 7.8.0?. Ranges were established in this laboratory for  healthy individuals and individuals with surgically?confirmed labyrinthine ?superior canal? dehiscence. oVEMP amplitudes are reported along with asymmetry ratio,  calculated as absolute value of 100%*?AmpRight ? AmpLeft?/ ?AmpRight + AmpLeft?. For cervical VEMPs amplitudes are also reported and normalized for underlying  muscle activity ?rectified EMG?, along with normalized asymmetry ratio, calculated as absolute value of 100%*?NormalizedAmpRight ? NormalizedAmpLeft?/  ?NormalizedAmpRight + NormalizedAmpLeft?.  General notes regarding interpretation of VEMP findings: The following are general notes about VEMP findings:  ? Abnormally large normalized cVEMP response amplitudes or abnormally large oVEMP response amplitudes may suggest a superior canal dehiscence or other  labyrinthine fistula in the ear being tested.  ? Sound evoked VEMPs typically are reduced or absent when there is a conductive hearing loss due to pathology at the tympanic membrane or in the middle ear.  Normal sound evoked VEMP responses in the setting of conductive hearing loss may also suggest a superior canal dehiscence or other labyrinthine fistula in the ear  being tested.  ? Absence of cVEMP responses may be due to saccule dysfunction, conductive  hearing loss, central lesion, or poor sternocleidomastoid muscle EMG tone.  ? Absence of oVEMP responses may be due to utricular dysfunction,  conductive hearing loss, central lesion, or abnormalities of oculomotor  neurons and/or extraocular muscles.    Audiometry:     Date: 08/12/2017 (Outside)  Speech Reception Threshold RIGHT: 20 dB  Speech Reception Threshold LEFT: 15 dB  Word Recognition Score RIGHT: 100 %  Word Recognition Score LEFT: 100 %  Audiologic Diagnosis:   Results:  Audiometric testing was completed using insert earphones. Pure tone test results revealed hearing within normal limits with air?bone gaps from 250?1000 Hz bilaterally.  Speech Reception Threshold ?SRT? was obtained down to 20 dB HL in the right ear and 15 dB HL in the left ear. Speech recognition ability, obtained at the patient?s most  comfortable listening level, was 100% in the right ear and 100% in the left ear.  Tympanometry revealed normal ear canal volume, peak pressure, and compliance, bilaterally.  Audio Results 08/12/2017:  Comments/Recommendations:  1. Follow up with Gean Quint, M.D..  2. Further hearing testing in conjunction with otologic management.      Impression/Plan:     Impression:   Kevin Peterson is a 37 y.o. year old male with h/o bilateral-sided SSCD, presenting with associated pulsatile tinnitus, autophony, hearing loss and internal amplification of sound, dizziness and vertigo. Surgical intervention of SSCD MCF approach repair and secondary craniotomy was discussed in clinic today and recommended at this time given patient's symptoms and review of testing. Risks, benefits, alternatives, and complications were thoroughly explained to the patient. Patient elected to proceed with surgical intervention at this time.       Recommendations:  I discussed with Kevin Peterson the option of proceeding with bilateral (laterality to be determined) craniotomy and secondary SSCD repair. Potential limitations, risks and benefits of the surgical intervention were discussed in detail and I reviewed the  process of undergoing a surgical procedure with Kevin Peterson. Patient was in agreement with this course of treatment, and will be in contact with surgery scheduling to determine an appropriate date.      I also provided the following recommendations:     >Patient is interested in surgery of SSCD MCF approach Repair and secondary craniotomy.       The patient is agreeable and amenable to this plan. They are thankful for my consultation, advice, and time I spent with them in consultation.      I spent a total time of 20 minutes in consultation and evaluation of this patient and family with greater than half this time in counseling and coordination of care as noted above.      Thank you for the kind referral and for allowing me to share in the care of Kevin Peterson. If you have any questions, please do not hesitate to contact [place] brain tumor program.      Sincerely,    Darol Destine, MD [facility]  Board Certified Neurosurgeon  Associate Professor of Neurosurgery, Radiation Oncology, and Head and Neck Surgery at [employer]      Director - Medical Student Education   Brain Tumor Program  Skull Miles and Superior Semicircular [place] Dehiscence Program Department of Neurosurgery at [facility] Department of Neurosurgery  [address], [address]  [place] [address]     [phone] (Appointments)  [phone] (Fax)    Please visit our website for more information  ([email or web address]          Attestation      SCRIBE SIGNATURE(S):  I, Kevin Peterson, am scribing for, and in the presence of, No att. providers found for the documentation of Kevin Peterson on 09/06/2017 at 7:25 PM.    PHYSICIAN SIGNATURE(S):  No att. providers found  09/06/2017 7:25 PM    I have reviewed this note, written by Kevin Peterson and attest that it is an accurate representation of my H & P and other events of the outpatient visit except if otherwise noted.

## 2017-09-11 ENCOUNTER — Ambulatory Visit: Payer: PRIVATE HEALTH INSURANCE

## 2017-10-02 ENCOUNTER — Ambulatory Visit: Payer: PRIVATE HEALTH INSURANCE

## 2017-10-10 ENCOUNTER — Ambulatory Visit: Payer: Commercial Managed Care - Pharmacy Benefit Manager

## 2017-10-22 DIAGNOSIS — H838X9 Other specified diseases of inner ear, unspecified ear: Secondary | ICD-10-CM

## 2017-10-23 ENCOUNTER — Inpatient Hospital Stay: Payer: PRIVATE HEALTH INSURANCE

## 2017-10-23 ENCOUNTER — Ambulatory Visit: Payer: PRIVATE HEALTH INSURANCE

## 2017-10-23 ENCOUNTER — Ambulatory Visit: Payer: Commercial Managed Care - Pharmacy Benefit Manager

## 2017-10-23 DIAGNOSIS — H900 Conductive hearing loss, bilateral: Secondary | ICD-10-CM

## 2017-10-23 DIAGNOSIS — H838X1 Other specified diseases of right inner ear: Secondary | ICD-10-CM

## 2017-10-23 DIAGNOSIS — R42 Dizziness and giddiness: Secondary | ICD-10-CM

## 2017-10-23 DIAGNOSIS — R2689 Other abnormalities of gait and mobility: Secondary | ICD-10-CM

## 2017-10-23 DIAGNOSIS — H93A9 Pulsatile tinnitus, unspecified ear: Secondary | ICD-10-CM

## 2017-10-23 MED ADMIN — GADOBUTROL 1 MMOL/ML IV SOLN: 8 mL | INTRAVENOUS | @ 03:00:00 | Stop: 2017-10-23 | NDC 50419032512

## 2017-10-23 NOTE — H&P
PATIENT: Kevin Peterson                                                                  MRN: [id number]  DOB:  02/01/1981        Referring Provider: No ref. provider found  Primary Care Provider: Farris Has    I had the pleasure of seeing Mr. Kevin Peterson today in Neurosurgical consultation.    History:     CC: f/u Right SSCD, left thin bony covering      History of Present Illness: Mr. Kevin Peterson is a 37 y.o. male w/ h/o B/L SSCD diagnosed in 2017, with symptoms reportedly beginning in [place] of 2017. Today, patient is here today for a pre-operative appointment and is scheduled for surgery tomorrow. He reports hearing his eyes move, neck movements, and notes his symptoms are more important on the RIGHT, which he believes causes his oscillopsia. He notes the most bothersome symptom is gait imbalance and most bothersome auditory symptom is pulsatile tinnitus. He reports sound distortion on the left. pt notes he cannot breathe well due to nasal blockage. Comes in with pre-operative imaging completed. Mr. Kevin Peterson is from [place] and is accompanied by his wife in clinic today. 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 No other neuro-otologic symptoms or complaints at this time.        Symptom Laterality: bilateral R>L   Auditory Symptoms: pulsatile tinnitus, autophony, hearing loss and internal amplification of sound   Vestibular Symptoms:  dizziness, vertigo, balance disequilibrium and oscillopsia   Other: none     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    LEFT: thin bone overlying superior canal     Prior Testing: outside CT Temporal Bones   Prior Ear Surgery: none    SSCD Symptom Summary:  Auditory Symptoms:  Endorses/Denies   Autophony Yes   Hyperacusis No   Hearing loss Yes   Internal amplification of sound Yes   Tinnitus aurium  No   Pulsatile tinnitus  Yes     Vestibular Symptoms:  Endorses/Denies   Dizziness  Yes   Noise-induced dizziness No   Vertigo Yes Balance disequilibrium Yes   Oscillopsia Yes     Other Symptoms: Endorses/Denies   Aural fullness No   Brain Fog No   Fatigue No   Headache No   Head Pressure  No   Migraines No         Past Medical History: No past medical history on file.     Past Surgical History: No past surgical history on file.    Current Medications:   Current Outpatient Prescriptions:   ???  cholecalciferol 400 units tablet, Take 400 Units by mouth daily., Disp: , Rfl:   ???  lisinopril 5 mg tablet, Take 5 mg by mouth daily., Disp: , Rfl:   ???  multivitamin tablet, Take by mouth daily., Disp: , Rfl:     Allergies: Penicillins    Social History:   Social History   Substance Use Topics   ??? Smoking status: Never Smoker   ??? Smokeless tobacco: Never Used   ??? Alcohol use Yes      Comment: occasional         Family History:  Family History   Problem Relation Age of Onset   ??? Anesthesia problems Neg Hx    ??? Malignant hypertension Neg Hx    ??? Hypotension Neg Hx    ??? Malignant hyperthermia Neg Hx        Review of Systems (14-point ROS): The patient was asked and responded to a 14 point review of systems regarding constitutional symptoms, eye symptoms, ears, nose, mouth, throat symptoms, cardiovascular symptoms, respiratory symptoms, gastrointestinal symptoms, genitourinary symptoms, musculoskeletal symptoms, integumentary symptoms, neurological symptoms, psychiatric symptoms, endocrine symptoms, hematologic/lymphatic symptoms, and allergic/ immunologic symptoms. The written questionnaire was reviewed, pertenent factors have been included in the HPI, and the complete list can be found in the patient's medical chart.      Physical Examination:   The patient underwent a physical examination as described below. The pertinent positive and negative findings are summarized after the description of the examination.    Constitutional ~The patient's developmental and nutritional status was assessed.   ~The patient's ability to comminicate was assesed. Head and Face ~The head and face were inspected for deformities.   ~Facial strength was assessed bilaterally.   Eyes ~Extraocular movements and primary gaze alignment were assessed.   Ears, Nose, Mouth &Throat ~The pinnae were examined.    ~The lips, teeth, and gums were examined for abnormalities.    Neck ~ The tracheal position was noted, and the neck was visually inspected to assess for gross asymmetries, abnormal neck masses, or thyromegally.   Respiratory ~The nature of the breathing and chest expansion/symmetry was observed.   Cardiovascular ~The patient was examined to determine the presence of any edema or jugular venous distension.   Abdomen ~The contour of the abdomen was noted.   Lymphatic ~The patient was examined for apparent lymphedema of the extremeties.   Musculoskeletal ~The patient was inspected for the presence of skeletal deformities.   Extremities ~The extremities were examined for any clubbing or cyanosis.   Skin ~The skin was examined for inflammatory or neoplastic conditions.   Neurologic ~The patient's orientation, mood, and affect were noted.   ~The cranial nerve  functions were examined(II-VII, IX-XII).     Pertinent findings on physical examination includes:    Alert and oriented x3    Face symetric  Tongue: midline    Following commands x4    No pronator drift   Ambulates without assistance     Pleasant    Sensation grossly intact   All other physical examination findings were noncontributory.      Review of Clinical Data:   I spend a significant amount of time reviewing the patients most recent clinic testing and discussed these findings.     Radiology:     >MR Brain w-wo contrast lab protocol dated on 10/23/2017:   IMPRESSION:  No evidence of infarct, intracranial hemorrhage, mass effect, or hydrocephalus. No abnormal intracranial enhancement.  PLEASE NOTE THAT THIS IS A PRELIMINARY REPORT THAT HAS NOT BEEN REVIEWED BY AN ATTENDING RADIOLOGIST. >CT Temporal Bones wo contrast dated on 10/23/2017:   IMPRESSION:  RIGHT: Findings suggestive of right superior semicircular canal dehiscence.  ???LEFT: Less severe thinning of the bony coverage of the superior semicircular canal, which may represent dehiscence in the appropriate clinical context.  PLEASE NOTE THAT THIS IS A PRELIMINARY REPORT THAT HAS NOT BEEN REVIEWED BY AN ATTENDING RADIOLOGIST.    >CT Brain lab protocol wo contrast dated on 10/23/2017:   IMPRESSION:  No acute intracranial hemorrhage, hydrocephalus, or mass effect.  See accompanying CT  temporal bones for evaluation of the semicircular canals.  PLEASE NOTE THAT THIS IS A PRELIMINARY REPORT THAT HAS NOT BEEN REVIEWED BY AN ATTENDING RADIOLOGIST.    > Outside CT Temporal Bones dated 08/15/2017 reviewed in clinic   B/L SSCD  Right clear dehiscence, left thin bone     Vestibular Testing:     > Outside VEMP testing dated on 08/12/2017 was reviewed and noted below:  Vestibular Clinical Testing Laboratory  Patient Name: Rolland Steinert.  Gender: male Age: 37 y.o.  Medical Record Number: [id number] Date of Eval: 08/12/2017  Referring Physician: Tommy Medal  Operator: Dutch Gray Clemons  Indications for vestibular testing: Dizziness and giddiness ? R42  Vestibular Evoked Myogenic Potentials ?VEMPs?  Cervical VEMP Results  500 Hz tone burst, 125 dB SPL ?Normal range for normalized response is 1.3?3.0?  Left ear Right ear  cVEMP response amplitude ?microvolts? 307.68 342.71  Rectified EMG ?underlying muscle activation, microvolts? 127 114  Normalized response ?unitless? 2.42 3.00  Percent asymmetry 10.75  Ocular VEMP Results  500 Hz tone bursts ?Normal response range is 0?17 microvolts?  Left ear Right ear  oVEMP response amplitude ?microvolts? 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  Clinical correlation is recommended for all abnormal results. Additional information regarding interpretation of VEMP studies is at the bottom of this report.  ???????? Notes on the Vestibular Test Battery ????????  VEMP Testing notes:  Vestibular evoked myogenic potentials ?VEMPs? are a family of auditory evoked potentials or mechanically evoked potentials that indicate short?latency responses from  the otolith organs of the inner ear. Ocular VEMPs likely represent utricular function, while sound?evoked cervical VEMPS represent saccular function. Both are subject to  losses with age and with particular vestibular disorders. Testing of ocular and cervical vestibular?evoked myogenic potentials ?o? and cVEMPs? in response to either: a.  500?Hz short tone bursts ?1 ms rise/fall time, 2 ms plateau; 125 dBSPL?; b. 0.1 ms clicks ?95 dB nHL?; c. manual reflex hammer taps; or d. mini taps with a Bruel and Kjaer  Mini?Shaker Type 4810, were performed with surface electrodes overlying the left and right inferior oblique extraocular muscles for ocular recording and using surface  electrodes overlying the left and right the sternocleidomastoid muscles using an ICS Chartr EP system ?version 7.8.0?. Ranges were established in this laboratory for  healthy individuals and individuals with surgically?confirmed labyrinthine ?superior canal? dehiscence. oVEMP amplitudes are reported along with asymmetry ratio,  calculated as absolute value of 100%*?AmpRight ? AmpLeft?/ ?AmpRight + AmpLeft?. For cervical VEMPs amplitudes are also reported and normalized for underlying  muscle activity ?rectified EMG?, along with normalized asymmetry ratio, calculated as absolute value of 100%*?NormalizedAmpRight ? NormalizedAmpLeft?/  ?NormalizedAmpRight + NormalizedAmpLeft?.  General notes regarding interpretation of VEMP findings:  The following are general notes about VEMP findings:  ? Abnormally large normalized cVEMP response amplitudes or abnormally large oVEMP response amplitudes may suggest a superior canal dehiscence or other  labyrinthine fistula in the ear being tested.  ? Sound evoked VEMPs typically are reduced or absent when there is a conductive hearing loss due to pathology at the tympanic membrane or in the middle ear.  Normal sound evoked VEMP responses in the setting of conductive hearing loss may also suggest a superior canal dehiscence or other labyrinthine fistula in the ear  being tested.  ? Absence of cVEMP responses may be due to saccule dysfunction, conductive  hearing loss, central  lesion, or poor sternocleidomastoid muscle EMG tone.  ? Absence of oVEMP responses may be due to utricular dysfunction,  conductive hearing loss, central lesion, or abnormalities of oculomotor  neurons and/or extraocular muscles.    Audiometry:     Date: 08/12/2017 (Outside)  Speech Reception Threshold RIGHT: 20 dB  Speech Reception Threshold LEFT: 15 dB  Word Recognition Score RIGHT: 100 %  Word Recognition Score LEFT: 100 %  Audiologic Diagnosis:   Results:  Audiometric testing was completed using insert earphones. Pure tone test results revealed hearing within normal limits with air?bone gaps from 250?1000 Hz bilaterally.  Speech Reception Threshold ?SRT? was obtained down to 20 dB HL in the right ear and 15 dB HL in the left ear. Speech recognition ability, obtained at the patient?s most  comfortable listening level, was 100% in the right ear and 100% in the left ear.  Tympanometry revealed normal ear canal volume, peak pressure, and compliance, bilaterally.  Audio Results 08/12/2017:  Comments/Recommendations:  1. Follow up with Gean Quint, M.D..  2. Further hearing testing in conjunction with otologic management.      Impression/Plan:     Impression:   Mr. Goates is a 37 y.o. year old male with h/o RIGHT SSCD, and thin bony covering over the LEFT superior semicircular canal, presenting with associated pulsatile tinnitus, autophony, hearing loss and internal amplification of sound, dizziness, oscillopsia, balance problems, and vertigo. Surgical intervention of SSCD MCF approach repair and secondary craniotomy was discussed in clinic today. Risks, benefits, alternatives, and complications were thoroughly explained to the patient. Patient is scheduled for surgery tomorrow.       Recommendations:  I discussed with Mr. Nicklaus the option of proceeding with right craniotomy and secondary SSCD repair. Potential limitations, risks and benefits of the surgical intervention were discussed in detail and I reviewed the process of undergoing a surgical procedure with Mr. Delcastillo. Patient was in agreement with this course of treatment, and is scheduled for surgery tomorrow.      I also provided the following recommendations:     >I asked patient to talk to Anesthesiologist prior to surgery, regarding his breathing and nasal symptoms.   >Patient is scheduled for surgery tomorrow for RIGHT SSCD MCF approach Repair and secondary craniotomy.     The patient is agreeable and amenable to this plan. They are thankful for my consultation, advice, and time I spent with them in consultation.      I spent a total time of 20 minutes in consultation and evaluation of this patient and family with greater than half this time in counseling and coordination of care as noted above.      Thank you for the kind referral and for allowing me to share in the care of Mr. Lashley. If you have any questions, please do not hesitate to contact [facility] brain tumor program.      Sincerely,    Darol Destine, MD [facility]  Board Certified Neurosurgeon  Associate Professor of Neurosurgery, Radiation Oncology, and Head and Neck Surgery at [facility]      Director - Medical Student Education   Brain Tumor Program  Skull [place] and Superior Semicircular [place] Dehiscence Program  Department of Neurosurgery at [facility] Department of Neurosurgery  [address], [address]  [place] [address] [phone] (Appointments)  [phone] (Fax)    Please visit our website for more information  ([email or web address]  Attestation      SCRIBE SIGNATURE(S):  I, Bethena Roys Zeeland, am scribing for, and in the presence of, Darol Destine, MD for the documentation of JAROM GOVAN on 10/23/2017 at 2:01 PM.    PHYSICIAN SIGNATURE(S):  Darol Destine, MD  10/23/2017 2:01 PM    I have reviewed this note, written by Lucia Bitter and attest that it is an accurate representation of my H & P and other events of the outpatient visit except if otherwise noted.

## 2017-10-24 ENCOUNTER — Ambulatory Visit: Payer: PRIVATE HEALTH INSURANCE

## 2017-10-24 ENCOUNTER — Ambulatory Visit: Payer: Commercial Managed Care - Pharmacy Benefit Manager

## 2017-10-24 ENCOUNTER — Inpatient Hospital Stay
Admit: 2017-10-24 | Discharge: 2017-10-26 | Disposition: A | Discharge status: Home / Self Care | Payer: PRIVATE HEALTH INSURANCE | Source: Home / Self Care

## 2017-10-24 DIAGNOSIS — H838X3 Other specified diseases of inner ear, bilateral: Secondary | ICD-10-CM

## 2017-10-24 DIAGNOSIS — H838X1 Other specified diseases of right inner ear: Secondary | ICD-10-CM

## 2017-10-24 LAB — Glucose,POC: GLUCOSE,POC: 113 mg/dL — ABNORMAL HIGH (ref 65–99)

## 2017-10-24 LAB — Magnesium: MAGNESIUM: 1.7 meq/L (ref 1.4–1.9)

## 2017-10-24 LAB — Calcium,Ionized: IONIZED CA++,CORRECTED: 1 mmol/L — ABNORMAL LOW (ref 1.09–1.29)

## 2017-10-24 LAB — Phosphorus: PHOSPHORUS: 2.5 mg/dL (ref 2.3–4.4)

## 2017-10-24 LAB — Comprehensive Metabolic Panel
ANION GAP: 16 mg/dL (ref 8–19)
POTASSIUM: 4.2 mmol/L (ref 3.6–5.3)
TOTAL PROTEIN: 7.2 g/dL (ref 6.1–8.2)

## 2017-10-24 LAB — TSH: TSH: 2.4 u[IU]/mL (ref 0.3–4.7)

## 2017-10-24 LAB — PTH, Intact: PTH, INTACT: 92 pg/mL — ABNORMAL HIGH (ref 11–51)

## 2017-10-24 MED ADMIN — LIDOCAINE HCL (CARDIAC) 20 MG/ML IV SOLN: INTRAVENOUS | @ 20:00:00 | Stop: 2017-10-24 | NDC 00409132305

## 2017-10-24 MED ADMIN — MIDAZOLAM HCL 10 MG/10ML IJ SOLN: INTRAVENOUS | @ 19:00:00 | Stop: 2017-10-24 | NDC 00409258705

## 2017-10-24 MED ADMIN — CEFAZOLIN SODIUM 1 G IJ SOLR: @ 21:00:00 | Stop: 2017-10-24 | NDC 00143992490

## 2017-10-24 MED ADMIN — PROPOFOL 200 MG/20ML IV EMUL: @ 20:00:00 | Stop: 2017-10-24 | NDC 63323026929

## 2017-10-24 MED ADMIN — PLASMA-LYTE A IV SOLN: INTRAVENOUS | @ 21:00:00 | Stop: 2017-10-24 | NDC 00338022104

## 2017-10-24 MED ADMIN — OXYCODONE HCL 5 MG PO TABS: 10 mg | ORAL | @ 23:00:00 | Stop: 2017-10-26 | NDC 68084035401

## 2017-10-24 MED ADMIN — PHENYLEPHRINE HCL 10 MG/ML IJ SOLN: INTRAVENOUS | @ 20:00:00 | Stop: 2017-10-24 | NDC 00641614225

## 2017-10-24 MED ADMIN — HYDROMORPHONE HCL 1 MG/ML IJ SOLN: INTRAVENOUS | @ 22:00:00 | Stop: 2017-10-24 | NDC 00409255201

## 2017-10-24 MED ADMIN — FENTANYL CITRATE (PF) 100 MCG/2ML IJ SOLN: INTRAVENOUS | @ 20:00:00 | Stop: 2017-10-24 | NDC 00641602725

## 2017-10-24 MED ADMIN — ACETAMINOPHEN 10 MG/ML IV SOLN: @ 21:00:00 | Stop: 2017-10-24 | NDC 43825010201

## 2017-10-24 MED ADMIN — SODIUM CHLORIDE 0.9 % IV SOLN: INTRAVENOUS | @ 19:00:00 | Stop: 2017-10-24 | NDC 00338004902

## 2017-10-24 MED ADMIN — BUPIVACAINE-EPINEPHRINE (PF) 0.25% -1:200000 IJ SOLN: @ 21:00:00 | Stop: 2017-10-24 | NDC 00409904201

## 2017-10-24 MED ADMIN — ONDANSETRON HCL 4 MG/2ML IJ SOLN: INTRAVENOUS | @ 22:00:00 | Stop: 2017-10-24 | NDC 00641607825

## 2017-10-24 MED ADMIN — DEXAMETHASONE SODIUM PHOSPHATE 4 MG/ML IJ SOLN: INTRAVENOUS | @ 20:00:00 | Stop: 2017-10-24 | NDC 67457042312

## 2017-10-24 MED ADMIN — SCOPOLAMINE 1.5 MG/3DAYS TD PT72: @ 19:00:00 | Stop: 2017-10-24 | NDC 66758020854

## 2017-10-24 MED ADMIN — EPHEDRINE SULFATE 50 MG/ML IJ SOLN: @ 21:00:00 | Stop: 2017-10-24 | NDC 17478041510

## 2017-10-24 MED ADMIN — PHENYLEPHRINE HCL 10 MG/ML IJ SOLN: INTRAVENOUS | @ 21:00:00 | Stop: 2017-10-24 | NDC 00641614225

## 2017-10-24 MED ADMIN — PHENYLEPHRINE HCL 10 MG/ML IJ SOLN: INTRAVENOUS | @ 22:00:00 | Stop: 2017-10-24 | NDC 00641614225

## 2017-10-24 MED ADMIN — MANNITOL 25 % IV SOLN: @ 20:00:00 | Stop: 2017-10-24 | NDC 00409403101

## 2017-10-24 MED ADMIN — TISSEEL VH 4 ML EX KIT: @ 21:00:00 | Stop: 2017-10-24 | NDC 00338421104

## 2017-10-24 MED ADMIN — ESMOLOL HCL 100 MG/10ML IV SOLN: @ 20:00:00 | Stop: 2017-10-24 | NDC 55150019410

## 2017-10-24 MED ADMIN — THROMBIN 5,000 UNITS, GELFOAM 1G POWDER WITH 0.9% NACL: @ 21:00:00 | Stop: 2017-10-24

## 2017-10-24 MED ADMIN — BACITRACIN ZINC 500 UNIT/GM EX OINT: @ 21:00:00 | Stop: 2017-10-24 | NDC 00472110534

## 2017-10-24 NOTE — H&P
UPDATED H&P REQUIREMENT    For [facility] and [facility]    WHAT IS THE STATUS OF THE PATIENT'S MOST CURRENT HISTORY AND PHYSICAL?   - The most current H&P is >24 hours and <30 days, and having examined the patient, I attest that there have been no changes. (This suffices as an update to the H&P).      REFER TO MEDICAL STAFF POLICIES REGARDING PRE-PROCEDURE HISTORY AND PHYSICAL EXAMINATION AND UPDATED H&P REQUIREMENTS BELOW:    [facility] and [facility] Medical Staff Policy 200 - For Patients Undergoing Procedures Requiring Moderate or Deep Sedation, General Anesthesia or Regional Anesthesia    Contents of a History and Physical Examination (H&P):    The H&P shall consist of chief complaint, history of present illness, allergies and medications, relevant social and family history, past medical history, review of systems and physical examination, and assessment and plan appropriate to the patient's age.    For Patients Undergoing Procedures Requiring Moderate or Deep Sedation, General Anesthesia or Regional Anesthesia:    1. An H&P shall be performed within 24 hours prior to the procedure by a qualified member of the medical staff or designee with appropriate privileges, except as noted in item 2 below.    2. If a complete history and physical was performed within thirty (30) calendar days prior to the patient's admission to the Medical Center for elective surgery, a member of the medical staff assumes the responsibility for the accuracy of the clinical information and will need to document in the medical record within twenty-four (24) hours of admission and prior to surgery or major invasive procedure, that they either attest that the history and physical has been reviewed and accepted, or document an update of the original history and physical relevant to the patient's current  clinical status.    3. Providing an H&P for patients undergoing surgery under local anesthesia is at the discretion of the Attending Physician.     4. When a procedure is performed by a dentist, podiatrist or other practitioner who is not privileged to perform an H&P, the anesthesiologist's assessment immediately prior to the procedure will constitute the 24 hour re-assessment.The dentist, podiatrist or other practitioner who is not privileged to perform an H&P will document the history and physical relevant to the procedure.    5. If the H&P and the written informed consent for the surgery or procedure are not recorded in the patient's medical record prior to surgery, the operation shall not be performed unless the attending physician states in writing that such a delay could lead to an adverse event or irreversible damage to the patient.    6. The above requirements shall not preclude the rendering of emergency medical or surgical care to a patient in dire circumstances.

## 2017-10-24 NOTE — Op Note
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 No pertinent past medical history.  History reviewed. No pertinent surgical history.  __________________________________________________________________________________    Labs  Recent Labs      10/24/17   1523   GLUCOSEPOC  113*     __________________________________________________________________________________    Vital Signs  Last Recorded Vital Signs:    10/24/17 1615   BP: 118/80   Pulse: 101   Resp: 18   Temp:    SpO2: 92%     Temp Readings from Last 1 Encounters:   10/24/17 36 ???C (96.8 ???F) (Temporal)     Pain Information (Last Filed)     Score Location Comments Edu?      5 None None None        __________________________________________________________________________________    Intake and Output  No intake/output data recorded.  I/O this shift:  In: 2400 [I.V.:2400]  Out: 1775 [Urine:1750; Blood:25]     __________________________________________________________________________________    IV Drips/Fluids/PCA:     __________________________________________________________________________________    Lines, Drains, and Airways  Urethral Catheter Standard 16 Fr. (Active)     Peripheral IV 20 G Left Antecubital (Active)       Peripheral IV 16 G Right Forearm (Active)     __________________________________________________________________________________ ----------------------------------------------------------------------------------------------------------------------      PACU NursingTransfer Note  4:18 PM, 10/24/2017      Isolation?   No    Difficult Airway?   No    Antibiotics in OR:  Last Antibiotic (last 24 hours) Showing orders from other encounters    Date/Time Action Medication Dose    10/24/17 1249 Given    ceFAZolin inj 2 g        Acetaminophen given @:  Med Administrations and Associated Flowsheet Values (last 24 hours) Showing orders from other encounters    Date/Time Action Medication Dose    10/24/17 1426 Given    acetaminophen IV inj 1,000 mg        Last pain medication:   Oxycodone PO 10mg  at 16:00    Does the patient use prescription pain medication at home (prior to admission)?   Unknown    Pain level: Acceptable for patient   Yes    Last Antiemetic:   None in PACU    Surgical Site:   Right temporal dressing intact. Small area in the frontal open to air  Lines, Drains, and Airways     Wound              Incision 10/24/17 Right Head less than 1 day              Respiratory is at baseline?   Yes    Has the patient received Flumazenil or Narcan in PACU?   No  (if ''Yes'', must be at least 45 minutes prior to transfer)     Aldrete: 10    Level of Consciousness:   Awake, Alert, Oriented x four    Cardiac Rhythm?   Normal Sinus    Diet: Regular    Tolerating liquids:   Yes    Activity:   MAE: Has not ambulated    Voided:  Foley    Significant Other Location:   [facility] Room    Will Transport to: Floor                       With: RN & Monitor    Continuity of Care Issues from OR or PACU:   Patient is scheduled to have an MRI and CT later on today. Screening forms done

## 2017-10-24 NOTE — Brief Op Note
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 [H83.8X3]       Post-op Diagnosis: Same    Operation(s)/Procedure(s):  RIGHT CRANIOTOMY FOR REPAIR OF CANAL DEHISCENCE/ SECONDARY REPAIR CSF LEAK CRANIAL FOSSA FREE TISSUE GRAFT  SECONDARY REPAIR CSF LEAK CRANIAL FOSSA FREE TISSUE GRAFT    Surgeon(s) and Role:  Panel 1:     Geraldine Contras., MD, PhD - Surgeon 1st Assist - Resident     * Darol Destine, MD - Primary    Panel 2:     * Ralph Dowdy., MD - Primary    Anesthesia Staff and Role:  Anesthesia Resident: Rosanne Ashing, MD  Anesthesiologist: Earma Reading, MD    Anesthesia Type:   General    Pre-Op Medications: Ancef, mannitol, decadron    Estimated Blood Loss: less than 50 mL    Findings: SSCD repaired    Complications: None; patient tolerated the operation(s)/procedure(s) well.                 Specimens:   * No specimens in log *    Drains:   Urethral Catheter Standard 16 Fr. (Active)   Assessment  Draining 10/24/2017  1:17 PM   Securement Method Securing device 10/24/2017  1:17 PM            Staff and Role:   Circulating Nurse: Lenora Boys., RN; Alona Bene, RN  Scrub Person: Pagtakhan, Thedore Mins., RN; Sherrie Mustache  Neurophysiology Tech: Gay Filler  Clinical Specialist: Eppie Gibson, MD, PhD    Date: 10/24/2017  Time: 2:55 PM

## 2017-10-24 NOTE — H&P
UPDATED H&P REQUIREMENT    For [facility] and [facility]    WHAT IS THE STATUS OF THE PATIENT'S MOST CURRENT HISTORY AND PHYSICAL?   - The most current H&P is >24 hours and <30 days, and having examined the patient, I attest that there have been no changes. (This suffices as an update to the H&P).      REFER TO MEDICAL STAFF POLICIES REGARDING PRE-PROCEDURE HISTORY AND PHYSICAL EXAMINATION AND UPDATED H&P REQUIREMENTS BELOW:    [facility] and -[facility] Medical Staff Policy 200 - For Patients Undergoing Procedures Requiring Moderate or Deep Sedation, General Anesthesia or Regional Anesthesia    Contents of a History and Physical Examination (H&P):    The H&P shall consist of chief complaint, history of present illness, allergies and medications, relevant social and family history, past medical history, review of systems and physical examination, and assessment and plan appropriate to the patient's age.    For Patients Undergoing Procedures Requiring Moderate or Deep Sedation, General Anesthesia or Regional Anesthesia:    1. An H&P shall be performed within 24 hours prior to the procedure by a qualified member of the medical staff or designee with appropriate privileges, except as noted in item 2 below.    2. If a complete history and physical was performed within thirty (30) calendar days prior to the patient's admission to the Medical Center for elective surgery, a member of the medical staff assumes the responsibility for the accuracy of the clinical information and will need to document in the medical record within twenty-four (24) hours of admission and prior to surgery or major invasive procedure, that they either attest that the history and physical has been reviewed and accepted, or document an update of the original history and physical relevant to the patient's current  clinical status.    3. Providing an H&P for patients undergoing surgery under local anesthesia is at the discretion of the Attending Physician.     4. When a procedure is performed by a dentist, podiatrist or other practitioner who is not privileged to perform an H&P, the anesthesiologist's assessment immediately prior to the procedure will constitute the 24 hour re-assessment.The dentist, podiatrist or other practitioner who is not privileged to perform an H&P will document the history and physical relevant to the procedure.    5. If the H&P and the written informed consent for the surgery or procedure are not recorded in the patient's medical record prior to surgery, the operation shall not be performed unless the attending physician states in writing that such a delay could lead to an adverse event or irreversible damage to the patient.    6. The above requirements shall not preclude the rendering of emergency medical or surgical care to a patient in dire circumstances.

## 2017-10-24 NOTE — Op Note
DATE OF OPERATION:  10/24/2017      PREOPERATIVE DIAGNOSIS:  1. Right dehiscent superior semicircular canal, middle cranial fossa.  2. Right skull base defect, floor middle cranial fossa.      POSTOPERATIVE DIAGNOSIS:  1. Right dehiscent superior semicircular canal, middle cranial fossa.  2. Right skull base defect, floor middle cranial fossa.        NAME OF OPERATION:  1. Repair of right semicircular canal dehiscence, middle cranial fossa.  2.   Repair of right surrounding skull base defect, floor middle cranial fossa  3. Computer assisted surgical navigation.  4. Use of operating microscope.        SURGEON:  Darol Destine, [place])       ANESTHESIA:  General endotracheal.      INDICATIONS:  Kevin Peterson is a 37 y.o. male who does have auditory and vestibular dysfunction. CT scan did demonstrate semicircular canal dehiscence within theright middle cranial fossa.  The skull base pre-auricular infratemporal craniotomy middle fossa approach was performed by Dr. Radene Knee, Skull Base Surgery Attending,  who will dictate the approach seperately. I was asked to perform the definitive repair after the approach was completed. Risks, benefits, alternatives, and complications were discussed with the patient including dizziness, hearing loss, partial or complete facial nerve weakness and paralysis, spinal fluid leakage, bleeding, infection.   The complexity was further elevated by the patient's co morbidities. This patient  has no past medical history on file.Marland Kitchen    PREOPERATIVE MEDICATIONS:   Preoperative antibiotics were administred within one hour of incision.     DVT PROPHYLAXIS:   Sequential Compression Devices were applied prior to skin incision.    DESCRIPTION OF PROCEDURE:   The skull base pre-auricular infratemporal middle fossa craniotomy was performed by Dr. Radene Knee.  After this was completed,  I was then called in to the surgery to perform the definitive repair of the semicircular canal dehiscence as well as the surrounding skull base defects.    My initial steps were to confirm the location of the external auditory canal. Using a freer, the periosteum overlying the root of the zygoma was elevated and the zygomatic root was definitively identified. This served as the anterior marking point of the external auditory canal. The periosteum was then swept inferiorly to expose the top portion of the external auditory canal without causing any tears into the ear canal skin. This allowed for delineation of the anterior and posterior limits of the external auditory canal, and the center point was then demarcated and used as a guide to the more medial internal auditory canal.    At this point, the dura was elevated more medially exposing the arcuate eminence. This was identified as the dense white bone of the otic capsule. Continued elevation of the dura was performed medial to the arcuate eminence to the limits of the internal auditory canal medially at the porus acousticus. Continued dissection both anteriorly and posteriorly in this location did yield excellent exposure of the entire area centered on the arcuate eminence. The semicircular canal dehiscence was identified and found to be along the floor of the right middle fossa, at the dome of the arcuate eminence tracking medially. Care was taken not to suction on this defect.   There were numerous defects in the bone of the floor of the middle cranial fossa that were also identified.      After exposure and preparation of the semicircular canal dehiscence and surrounding skull base defects for repair, there  were not changes in the intraoperative monitoring of the VIII and VII cranial nerves.  The location of the semicircular canal dehiscence was then confirmed using the neuronavigation system as well as the measurement from the outer cortical bone edge of 2.5 cm in depth. At this point the definitive repair was performed. Using warm bone wax, the bone wax was first placed just medial to the defect and gently secured into position using a micro-cottonoid. Additional bone wax was then placed directly onto the semicircular canal dehiscence.  To address the surrounding skull base defects, bone wax was also placed in the surrounding areas both anteriorly, posteriorly, and finally lateral to the semicircular canal dehiscence.  All of these were sequentially secured using gently pressure with a moist micro-cottonoid.    The repair was continued using a piece of bone harvested from the initial craniectomy approach. This piece of bone was customized to the appropriate size and thinned using the drill then placed within the bone wax over the location of the skull base defects to further support this repair. Next, heliostat matrix was then placed over the entire area to support the repair. Various sized pieces were used. Tisseal was then placed over the entire area.    After the skull base defect was repaired, the approach surgeon returned to close the craniotomy defect. This is dictated separately.  ?  COMPLICATIONS:    There were no apparent complications. There were no untoward stimulation of the facial nerve throughout the entirety of the case. The intraoperative BAER testing was unchanged from its baseline.    DRAINS:   No drains.     COMPLICATIONS:  None.      ESTIMATED BLOOD LOSS:  Estimated blood loss for the approach portion of the surgery was less than 20cc.      SPECIMENS:  None from the approach portion of the surgery.    DISPOSITION:   Stable, extubated to PACU.       I the attending physician performed all aspects of the case dictated.        Darol Destine, MD Janetta Hora [phone])  Associate Professor  Department of Neurological Surgery, Radiation Oncology, Head and Neck Surgery    [facility]  [address] [place], [place] [address] [phone] (Appointments)  [phone] (Academic Offices)  [phone] (Fax)  ______________________________________  [facility] .[email or web address]  [email or web address]

## 2017-10-24 NOTE — OR Nursing
Attempted to updated family in [place] and cell phone. No answer.

## 2017-10-25 DIAGNOSIS — H900 Conductive hearing loss, bilateral: Secondary | ICD-10-CM

## 2017-10-25 DIAGNOSIS — H93A9 Pulsatile tinnitus, unspecified ear: Secondary | ICD-10-CM

## 2017-10-25 DIAGNOSIS — R2689 Other abnormalities of gait and mobility: Secondary | ICD-10-CM

## 2017-10-25 DIAGNOSIS — H838X3 Other specified diseases of inner ear, bilateral: Secondary | ICD-10-CM

## 2017-10-25 DIAGNOSIS — R42 Dizziness and giddiness: Secondary | ICD-10-CM

## 2017-10-25 LAB — Glucose,POC
GLUCOSE,POC: 200 mg/dL — ABNORMAL HIGH (ref 65–99)
GLUCOSE,POC: 149 mg/dL — ABNORMAL HIGH (ref 65–99)
GLUCOSE,POC: 179 mg/dL — ABNORMAL HIGH (ref 65–99)
GLUCOSE,POC: 370 mg/dL — ABNORMAL HIGH (ref 65–99)

## 2017-10-25 LAB — Free T3: FREE T3: 353 pg/dL (ref 222–383)

## 2017-10-25 LAB — Testosterone: TESTOSTERONE: 121 ng/dL — ABNORMAL LOW (ref 200–1000)

## 2017-10-25 LAB — Vitamin D,25-Hydroxy: VITAMIN D,25-HYDROXY: 23 ng/mL (ref 20–50)

## 2017-10-25 MED ORDER — SCOPOLAMINE 1.5 MG/3DAYS TD PT72
1 | MEDICATED_PATCH | TRANSDERMAL | 0 refills | 18.00000 days | Status: AC
Start: 2017-10-25 — End: ?
  Filled 2017-10-27: qty 6, 18d supply, fill #0

## 2017-10-25 MED ORDER — ONDANSETRON HCL 4 MG PO TABS
4 mg | ORAL_TABLET | Freq: Four times a day (QID) | ORAL | 0 refills | 21.00 days | Status: AC | PRN
Start: 2017-10-25 — End: ?
  Filled 2017-10-27: qty 18, 21d supply, fill #0

## 2017-10-25 MED ORDER — OXYCODONE HCL 5 MG PO TABS
10 mg | ORAL_TABLET | ORAL | 0 refills | 4.00 days | Status: AC | PRN
Start: 2017-10-25 — End: 2018-10-29
  Filled 2017-10-27: qty 40, 4d supply, fill #0

## 2017-10-25 MED ORDER — PROCHLORPERAZINE 25 MG RE SUPP
25 mg | Freq: Two times a day (BID) | RECTAL | 0 refills | 6.00 days | Status: AC | PRN
Start: 2017-10-25 — End: ?
  Filled 2017-10-29: qty 12, 6d supply, fill #0

## 2017-10-25 MED ORDER — PANTOPRAZOLE SODIUM 40 MG PO TBEC
40 mg | ORAL_TABLET | Freq: Every day | ORAL | 0 refills | 30.00 days | Status: AC
Start: 2017-10-25 — End: ?
  Filled 2017-10-27: qty 30, 30d supply, fill #0

## 2017-10-25 MED ORDER — DSS 100 MG PO CAPS
100 mg | ORAL_CAPSULE | Freq: Two times a day (BID) | ORAL | 0 refills | 30.00 days | Status: AC
Start: 2017-10-25 — End: ?

## 2017-10-25 MED ORDER — BISACODYL 5 MG PO TBEC
5 mg | ORAL_TABLET | Freq: Every day | ORAL | 0 refills | 100.00000 days | Status: AC | PRN
Start: 2017-10-25 — End: ?
  Filled 2017-10-27: qty 100, 100d supply, fill #0

## 2017-10-25 MED ORDER — ACETAMINOPHEN 325 MG PO TABS
650 mg | ORAL_TABLET | Freq: Four times a day (QID) | ORAL | 0 refills | 13.00000 days | Status: AC | PRN
Start: 2017-10-25 — End: ?
  Filled 2017-10-27: qty 100, 13d supply, fill #0

## 2017-10-25 MED ADMIN — HEPARIN SODIUM (PORCINE) 5000 UNIT/ML IJ SOLN: 5000 [IU] | SUBCUTANEOUS | @ 15:00:00 | Stop: 2017-10-26 | NDC 67457037499

## 2017-10-25 MED ADMIN — INSULIN REGULAR HUMAN 100 UNIT/ML FOR CORRECTIVE: 3 mL | SUBCUTANEOUS | @ 20:00:00 | Stop: 2017-10-26 | NDC 00002821517

## 2017-10-25 MED ADMIN — HYDROMORPHONE HCL 1 MG/ML IJ SOLN: .4 mg | INTRAVENOUS | @ 07:00:00 | Stop: 2017-10-25 | NDC 76045000910

## 2017-10-25 MED ADMIN — OXYCODONE HCL 5 MG PO TABS: 10 mg | ORAL | @ 07:00:00 | Stop: 2017-10-26 | NDC 68084035401

## 2017-10-25 MED ADMIN — HYDROCHLOROTHIAZIDE 25 MG PO TABS: 25 mg | ORAL | @ 15:00:00 | Stop: 2017-10-26 | NDC 63739012810

## 2017-10-25 MED ADMIN — CEFAZOLIN SODIUM-DEXTROSE 1-4 GM/50ML-% IV SOLN: 1 g | INTRAVENOUS | @ 04:00:00 | Stop: 2017-10-25 | NDC 00338350341

## 2017-10-25 MED ADMIN — HYDROMORPHONE HCL 1 MG/ML IJ SOLN: .4 mg | INTRAVENOUS | @ 01:00:00 | Stop: 2017-10-25 | NDC 76045000910

## 2017-10-25 MED ADMIN — ACETAMINOPHEN 325 MG PO TABS: 650 mg | ORAL | @ 15:00:00 | Stop: 2017-10-26 | NDC 63739044001

## 2017-10-25 MED ADMIN — HYDROMORPHONE HCL 1 MG/ML IJ SOLN: .4 mg | INTRAVENOUS | @ 08:00:00 | Stop: 2017-10-25 | NDC 76045000910

## 2017-10-25 MED ADMIN — OXYCODONE HCL 5 MG PO TABS: 10 mg | ORAL | @ 15:00:00 | Stop: 2017-10-26 | NDC 68084035401

## 2017-10-25 MED ADMIN — DEXAMETHASONE 6 MG PO TABS: 6 mg | ORAL | @ 01:00:00 | Stop: 2017-10-26 | NDC 00054818325

## 2017-10-25 MED ADMIN — HYDROMORPHONE HCL 1 MG/ML IJ SOLN: .4 mg | INTRAVENOUS | @ 04:00:00 | Stop: 2017-10-25 | NDC 76045000910

## 2017-10-25 MED ADMIN — DEXAMETHASONE 6 MG PO TABS: 6 mg | ORAL | @ 23:00:00 | Stop: 2017-10-26 | NDC 00054818325

## 2017-10-25 MED ADMIN — PANTOPRAZOLE SODIUM 40 MG PO TBEC: 40 mg | ORAL | @ 01:00:00 | Stop: 2017-10-26 | NDC 68084081309

## 2017-10-25 MED ADMIN — LISINOPRIL 2.5 MG PO TABS: 5 mg | ORAL | @ 15:00:00 | Stop: 2017-10-26 | NDC 68084076525

## 2017-10-25 MED ADMIN — OXYCODONE HCL 5 MG PO TABS: 10 mg | ORAL | @ 11:00:00 | Stop: 2017-10-26 | NDC 68084035401

## 2017-10-25 MED ADMIN — HYDROMORPHONE HCL 1 MG/ML IJ SOLN: .4 mg | INTRAVENOUS | @ 13:00:00 | Stop: 2017-10-25 | NDC 76045000910

## 2017-10-25 MED ADMIN — DEXAMETHASONE 6 MG PO TABS: 6 mg | ORAL | @ 15:00:00 | Stop: 2017-10-26 | NDC 00054818325

## 2017-10-25 MED ADMIN — OXYCODONE HCL 5 MG PO TABS: 10 mg | ORAL | @ 20:00:00 | Stop: 2017-10-26 | NDC 68084035401

## 2017-10-25 MED ADMIN — OXYCODONE HCL 5 MG PO TABS: 10 mg | ORAL | @ 03:00:00 | Stop: 2017-10-26 | NDC 68084035401

## 2017-10-25 MED ADMIN — DOCUSATE SODIUM 100 MG PO CAPS: 100 mg | ORAL | @ 15:00:00 | Stop: 2017-10-26 | NDC 00904645561

## 2017-10-25 MED ADMIN — HYDROMORPHONE HCL 1 MG/ML IJ SOLN: .4 mg | INTRAVENOUS | @ 11:00:00 | Stop: 2017-10-25 | NDC 76045000910

## 2017-10-25 MED ADMIN — ATORVASTATIN CALCIUM 10 MG PO TABS: 10 mg | ORAL | @ 15:00:00 | Stop: 2017-10-26 | NDC 00904629061

## 2017-10-25 MED ADMIN — DEXAMETHASONE 6 MG PO TABS: 6 mg | ORAL | @ 07:00:00 | Stop: 2017-10-26 | NDC 00054818325

## 2017-10-25 MED ADMIN — IBUPROFEN 400 MG PO TABS: 400 mg | ORAL | @ 18:00:00 | Stop: 2017-10-26 | NDC 00904585361

## 2017-10-25 MED ADMIN — PANTOPRAZOLE SODIUM 40 MG PO TBEC: 40 mg | ORAL | @ 15:00:00 | Stop: 2017-10-26 | NDC 68084081309

## 2017-10-25 MED ADMIN — INSULIN REGULAR HUMAN 100 UNIT/ML FOR CORRECTIVE: 3 mL | SUBCUTANEOUS | @ 05:00:00 | Stop: 2017-10-26 | NDC 00002821517

## 2017-10-25 MED ADMIN — IBUPROFEN 400 MG PO TABS: 400 mg | ORAL | Stop: 2017-10-26 | NDC 00904585361

## 2017-10-25 MED ADMIN — ACETAMINOPHEN 325 MG PO TABS: 650 mg | ORAL | @ 03:00:00 | Stop: 2017-10-26 | NDC 63739044001

## 2017-10-25 MED ADMIN — VITAMIN D (CHOLECALCIFEROL) 10 MCG (400 UNIT) PO TABS: 400 [IU] | ORAL | @ 15:00:00 | Stop: 2017-10-26 | NDC 77333094810

## 2017-10-25 MED ADMIN — ACETAMINOPHEN 325 MG PO TABS: 650 mg | ORAL | @ 21:00:00 | Stop: 2017-10-26 | NDC 63739044001

## 2017-10-25 MED ADMIN — ACETAMINOPHEN 325 MG PO TABS: 650 mg | ORAL | @ 08:00:00 | Stop: 2017-10-26 | NDC 63739044001

## 2017-10-25 MED ADMIN — CEFAZOLIN SODIUM-DEXTROSE 1-4 GM/50ML-% IV SOLN: 1 g | INTRAVENOUS | @ 15:00:00 | Stop: 2017-10-25 | NDC 00338350341

## 2017-10-25 NOTE — Progress Notes
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 Hearing intact bilaterally. Tongue midline.  Following commands x4  Motor: 5/5 throughout, no pronator drift  Sensation grossly intact  Incision: clean, dry, intact, no erythema or drainage    Chest: symmetrical chest rise, CTA in all lobes, NSR, (-)murmur  Vascular: 2+ pulses throughout (-)edema, cyanosis  ABD: soft, nontender, flat, normoactive bowel sounds    Lines and Drains  CENTRAL LINE: None  DRAINS: None  FOLEY: None  SUTURE/STAPLE: Due 2 weeks from OR date    DATA   Pre-operative surgical visit and medical clearance visit documentation obtained and reviewed. Clinical lab tests reviewed daily. All associated imaging for this patient was independently reviewed and interpreted as listed below:    CT temporal bone 3/21  MRI auditory canals 3/21    ASSESSMENT AND PLAN:   HPI: Kevin Peterson is a 34M, w/ hx HTN, dizziness, and otologic symptoms, now presenting for right craniotomy for repair of canal dehiscence/cranioplasty/secondary repair CSF leak cranial fossa free tissue graft.    NEURO  S/p SSCD 3/21  Hx Dizziness, otologic symptoms   ??? Steroid: Decadron taper  ??? CT temporal bone and MRI auditory canals completed  ??? Suture/staple removal on POD# 14 Due date: 11/07/2017    PAIN: Post-operative pain  ??? Tylenol  ??? Ibuprofen, Dilaudid, oxycodone PRN  ??? Plan to wean off IV narcotics today    RESPIRATORY  ??? Incentive spirometry    CARDIOVASCULAR  Hx HTN  ??? Goal SBP 90-160  ??? Keep K >4 and Mg >2  ??? Home Lisinopril, HCTZ, Atorvastatin  ??? Heparin SQ DVT ppx BID, SCDs, attempt ambulation    GU / RENAL  ??? Continue to monitor UOP    ID  ??? Cefazolin while inpatient    ENDOCRINE  ??? Glucose goal 100-140   ??? Glycemic control with SSI    GI / HEPATIC  ??? Diet: Regular  ??? Bowel regimen  ??? Ulcer prophylaxis  ??? Vitamin D per home  ??? Aggressive PRN anti-emetics: Scopalamine, PRN Zofran, Tigan, Promethazine    Disposition  - PT/OT/Speech Recs: Pending NERVs  The patient was examined and discussed with the resident Neurosurgery team & Attending Darol Destine, MD who agrees with assessment and plan listed above.    Author:    Maceo Pro  7:53 AM 10/25/2017  Department of Neurological Surgery  [phone]

## 2017-10-25 NOTE — Progress Notes
Neurosurgery Progress Note   LOS: 1 day 1 Day Post-Op  Neurosurgery Attending: Threasa Beards  36 y.o.male s/p R SSCD repair on 3/21.  3/22: CT, MRI completed, pre operative symptoms improving, feeling subjectively increased ear fullness       Exam  Temp:  [36 C (96.8 F)-37.1 C (98.8 F)] 36.5 C (97.7 F)  Heart Rate:  [68-112] 68  Resp:  [10-21] 10  BP: (100-154)/(63-106) 100/74  NBP Mean:  [77-97] 83  SpO2:  [92 %-100 %] 95 %     EXAM:  Pre-op symptoms: Autophony, pulsatile tinnitus, vertigo  Aao x3  Vfftc  Hearing intact to finger rub bilaterall  5/5 bue  5/5 ble  No sensory drift        Labs / Imaging  Recent Labs      10/24/17   1606   NA  136         Plan  - 6ICU, SSCD pathway  [x]  MRI  [x]  CT T-bone  [x]  SSCD labs ordered  - Ancef (received without incident despite penicillin allergy)  - transfer to floor  - vestibular PT

## 2017-10-25 NOTE — Consults
Head and Neck Progress Note    ID: 62M with history of semicircular canal dehiscence POD 2 s/p right craniotomy for repair of canal dehiscence, cranioplasty, secondary repair CSF leak, cranial fossa free tissue graft with H&N and Neurosurgery teams.    Interval events: AVSS. No acute events overnight. Tolerating PO.     Exam:   Alert and oriented  Eyes open spontaneously, EOMI  FN intact bilaterally  No focal neuro deficits  Incision appears intact with dressing in place       A/P: 62M POD 2 s/p SCCD repair, doing well, denies nausea or dizziness. Patient is tolerating PO intake, adequate urine output, and reports pain is well controlled.  - Please follow up with Dr. Radene Knee in H&N Surgery clinic as scheduled  - Disposition per Neurosurgery team  - Please page on-call H&N resident at [phone] with any questions or concerns    Beaulah Dinning, NP  Head and Neck Surgery  p [phone]

## 2017-10-25 NOTE — Other
Patients Clinical Goal:   Clinical Goal(s) for the Shift: pt safety/comfort, pain mgmt, CT/MRI, accuchecks AC and HS  Identify possible barriers to advancing the care plan:   Stability of the patient: Moderately Stable - low risk of patient condition declining or worsening   End of Shift Summary: NERVS protocol. Afebrile,VSS. Pain 3-4/10, controlled with 10mg  Oxycodone every 4 hours, Dilaudid 0.4mg  IV every 2 hours and Tylenol. A/O/4. Pt able to sit at side of bed for >5 minutes. Room air. Tolerating PO with no n/v. Adequate UOP, no BM overnight. Foley to be removed at 0600. MRI/CT performed. Accuchecks AC+HS. BG 200 treated with 1 unit insulin.

## 2017-10-25 NOTE — Nursing Note
1915-Reported by day shift nurse, swallow study passed. Pt has eaten all food on tray at shift change. NERVS protocol.  2003-Pt refused to get up at present. Pt in mild pain and mild dizziness. Will attempt to sit at edge of bed later.  2209-1 unit insulin given for BG=200.  2215-Pt to MRI/CT, with RN, escort, on monitor, RA.

## 2017-10-25 NOTE — Other
Patients Clinical Goal:      Identify possible barriers to advancing the care plan:   Stability of the patient:  End of Summary    Pt has remained neurologically stable, moving all limbs  Complained of pain and has been given iv dilaudid

## 2017-10-26 LAB — Glucose,POC
GLUCOSE,POC: 134 mg/dL — ABNORMAL HIGH (ref 65–99)
GLUCOSE,POC: 148 mg/dL — ABNORMAL HIGH (ref 65–99)
GLUCOSE,POC: 133 mg/dL — ABNORMAL HIGH (ref 65–99)

## 2017-10-26 MED ORDER — DEXAMETHASONE 2 MG PO TABS
2 mg | ORAL_TABLET | Freq: Three times a day (TID) | ORAL | 0 refills | 3 days | Status: AC
Start: 2017-10-26 — End: ?

## 2017-10-26 MED ORDER — DEXAMETHASONE 1 MG PO TABS
1 mg | ORAL_TABLET | Freq: Every day | ORAL | 0 refills | 3.00 days | Status: AC
Start: 2017-10-26 — End: ?

## 2017-10-26 MED ORDER — DEXAMETHASONE 1 MG PO TABS
1 mg | ORAL_TABLET | Freq: Three times a day (TID) | ORAL | 0 refills | 11.00000 days | Status: AC
Start: 2017-10-26 — End: ?

## 2017-10-26 MED ORDER — DEXAMETHASONE 4 MG PO TABS
4 mg | ORAL_TABLET | Freq: Three times a day (TID) | ORAL | 00 refills | 3.00 days | Status: AC
Start: 2017-10-26 — End: ?

## 2017-10-26 MED ORDER — DEXAMETHASONE 6 MG PO TABS
6 mg | ORAL_TABLET | Freq: Three times a day (TID) | ORAL | 0 refills | 1.00000 days | Status: AC
Start: 2017-10-26 — End: ?
  Filled 2017-10-27: qty 84, 11d supply, fill #0

## 2017-10-26 MED ADMIN — HEPARIN SODIUM (PORCINE) 5000 UNIT/ML IJ SOLN: 5000 [IU] | SUBCUTANEOUS | @ 16:00:00 | Stop: 2017-10-26 | NDC 67457037499

## 2017-10-26 MED ADMIN — ACETAMINOPHEN 325 MG PO TABS: 650 mg | ORAL | @ 10:00:00 | Stop: 2017-10-26 | NDC 63739044001

## 2017-10-26 MED ADMIN — OXYCODONE HCL 5 MG PO TABS: 10 mg | ORAL | @ 01:00:00 | Stop: 2017-10-26 | NDC 68084035401

## 2017-10-26 MED ADMIN — ATORVASTATIN CALCIUM 10 MG PO TABS: 10 mg | ORAL | @ 16:00:00 | Stop: 2017-10-26 | NDC 00904629061

## 2017-10-26 MED ADMIN — HYDROCHLOROTHIAZIDE 25 MG PO TABS: 25 mg | ORAL | @ 16:00:00 | Stop: 2017-10-26 | NDC 63739012810

## 2017-10-26 MED ADMIN — DEXAMETHASONE 6 MG PO TABS: 6 mg | ORAL | @ 07:00:00 | Stop: 2017-10-26 | NDC 00054818325

## 2017-10-26 MED ADMIN — PANTOPRAZOLE SODIUM 40 MG PO TBEC: 40 mg | ORAL | @ 16:00:00 | Stop: 2017-10-26 | NDC 68084081309

## 2017-10-26 MED ADMIN — IBUPROFEN 400 MG PO TABS: 400 mg | ORAL | @ 14:00:00 | Stop: 2017-10-26 | NDC 00904585361

## 2017-10-26 MED ADMIN — DOCUSATE SODIUM 100 MG PO CAPS: 100 mg | ORAL | @ 16:00:00 | Stop: 2017-10-26 | NDC 00904645561

## 2017-10-26 MED ADMIN — POLYETHYLENE GLYCOL 3350 17 G PO PACK: 17 g | ORAL | @ 16:00:00 | Stop: 2017-10-26 | NDC 68084043098

## 2017-10-26 MED ADMIN — HEPARIN SODIUM (PORCINE) 5000 UNIT/ML IJ SOLN: 5000 [IU] | SUBCUTANEOUS | @ 04:00:00 | Stop: 2017-10-26 | NDC 67457037499

## 2017-10-26 MED ADMIN — DEXAMETHASONE 6 MG PO TABS: 6 mg | ORAL | @ 16:00:00 | Stop: 2017-10-26 | NDC 00054818325

## 2017-10-26 MED ADMIN — ACETAMINOPHEN 325 MG PO TABS: 650 mg | ORAL | @ 16:00:00 | Stop: 2017-10-26 | NDC 63739044001

## 2017-10-26 MED ADMIN — OXYCODONE HCL 5 MG PO TABS: 10 mg | ORAL | @ 06:00:00 | Stop: 2017-10-26 | NDC 68084035401

## 2017-10-26 MED ADMIN — ACETAMINOPHEN 325 MG PO TABS: 650 mg | ORAL | @ 04:00:00 | Stop: 2017-10-26 | NDC 63739044001

## 2017-10-26 MED ADMIN — DOCUSATE SODIUM 100 MG PO CAPS: 100 mg | ORAL | @ 04:00:00 | Stop: 2017-10-26 | NDC 00904645561

## 2017-10-26 MED ADMIN — VITAMIN D (CHOLECALCIFEROL) 10 MCG (400 UNIT) PO TABS: 400 [IU] | ORAL | @ 16:00:00 | Stop: 2017-10-26 | NDC 77333094810

## 2017-10-26 MED ADMIN — LISINOPRIL 2.5 MG PO TABS: 5 mg | ORAL | @ 16:00:00 | Stop: 2017-10-26 | NDC 68084076525

## 2017-10-26 NOTE — Other
Patients Clinical Goal:   Clinical Goal(s) for the Shift: pt safety/comfort, pain mgmt, CT/MRI, accuchecks AC and HS  Identify possible barriers to advancing the care plan:   Stability of the patient: Moderately Stable - low risk of patient condition declining or worsening   End of Shift Summary: SSCD of both ears, POD 1, NERVS goals passed, a/ox4, dizziness when getting OOB, turning head, c/o HA, controlled w/ atc tylenol, ibuprofen 400mg  q6 PRN and oxy 10mg  q4 PRN.  Steady oob, incision behind the R ear w/ telfa, staples c/d/i.  Pending dc home tomorrow.

## 2017-10-26 NOTE — Other
Patients Clinical Goal:   Clinical Goal(s) for the Shift: fall prevention, comfort, safety, VSS  Identify possible barriers to advancing the care plan:   Stability of the patient: Moderately Stable - low risk of patient condition declining or worsening   End of Shift Summary: s/p SCCD repair of both ears 10/24/17. NERVS POD 3, passed all goals. PMHx: HTN. A/Ox4 - calm, cooperative, follows commands. C/o of dizziness, educated patient on safety precautions and call bell usage. Pupils 4B OU. All ext 5/5. Sens intact. Non-monitored. SBP goal 90-160, maintained. Afebrile. RA-clear bilaterally. Voids to BR. LBM 3/21. Regular diet. R ear Incision, CDI, telfa & tegaderm. Steady gait. ACHS accuchecks. Decadron taper. C/o of head pain, given oxy 10mg  x1 and tylenol ATC. No n/v. Plan: d/c to home today.

## 2017-10-26 NOTE — Consults
Patient: Kevin Peterson ~ MRN: [id number] ~ DOB: [birth year] ~ Admission Date: 10/24/2017      Diagnosis:   1. 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 Superior semicircular canal dehiscence of right ear        Case Manager Progress Note - 10/25/2017  Informed during IDR by NP Synetta Shadow [phone]) the patient is anticipated to discharge on 03/23 to home without home health or DME needs. The patient is following the NERVS protocol.      Bradly Chris, RN  Clinical Case Manager ~ Office: [phone] ~ Pager: [phone]

## 2017-10-26 NOTE — Progress Notes
Pharmaceutical Services ??? Discharge Medication Counseling     Patient Name: Kevin Peterson  Medical Record Number: [id number]  Admit date: 10/24/2017 9:48 AM    Age: 37 y.o.  Sex: male  Allergies: Penicillins    I have counseled the patient on all new prescriptions.  The purpose, potential side effects and any special instructions related to each medication has been discussed.      Discharge medication list discussed:     Medication List      START taking these medications    acetaminophen 325 mg tablet  Commonly known as:  Tylenol  Take 2 tablets (650 mg total) by mouth every six (6) hours as needed for Pain.     bisacodyl 5 mg EC tablet  Commonly known as:  Dulcolax  Take 1 tablet (5 mg total) by mouth daily as needed for Constipation (Second line).     * dexamethasone 6 mg tablet  Take 1 tablet (6 mg total) by mouth every eight (8) hours for 3 doses.     * dexamethasone 4 mg tablet  Take 1 tablet (4 mg total) by mouth every eight (8) hours for 9 doses.  Start taking on:  10/27/2017     * dexamethasone 2 mg tablet  Take 1 tablet (2 mg total) by mouth every eight (8) hours for 9 doses.  Start taking on:  10/30/2017     * dexamethasone 1 mg tablet  Take 6 tablets (6mg ) orally every 8 hours for 3 doses, then 4 tablets (4mg ) every 8 hours for 9 doses, then 2 tablets (2mg ) every 8 hours for 9 doses, then 1 tablet (1mg ) every 8 hours for 9 doses, then 1 tablet (1mg ) daily for 3 doses.  Start taking on:  11/02/2017     * dexamethasone 1 mg tablet  Take 1 tablet (1 mg total) by mouth daily for 3 doses.  Start taking on:  11/06/2017     docusate 100 mg capsule  Commonly known as:  Colace  Take 1 capsule (100 mg total) by mouth two (2) times daily.     ondansetron 4 mg tablet  Commonly known as:  Zofran  Take 1 tablet (4 mg total) by mouth every six (6) hours as needed for Nausea or Vomiting.     oxyCODONE 5 mg tablet  Take 2 tablets (10 mg total) by mouth every four (4) hours as needed for pain     pantoprazole 40 mg DR tablet Commonly known as:  Protonix  Take 1 tablet (40 mg total) by mouth daily.     prochlorperazine 25 mg suppository  Commonly known as:  Compazine  Unwrap and insert 1 suppository (25 mg total) rectally every twelve (12) hours as needed for Nausea or Vomiting.     scopolamine 1.5 mg patch  Commonly known as:  Transderm-Scop  Place 1 patch onto the skin every three (3) days.  Start taking on:  10/27/2017        * This list has 5 medication(s) that are the same as other medications prescribed for you. Read the directions carefully, and ask your doctor or other care provider to review them with you.            CONTINUE taking these medications    atorvastatin 10 mg tablet  Commonly known as:  Lipitor     cholecalciferol 400 units tablet     hydroCHLOROthiazide 25 mg tablet     lisinopril 5 mg tablet  Commonly  known as:  Prinivil,Zestril     multivitamin tablet           Where to Get Your Medications      These medications were sent to [facility] [phone])  [address], [place] [address]    Hours:  Mon-Fri 8AM-9PM, Sat 8AM-7PM, Sun/Holidays 8AM-5PM (Closed 1PM-2PM for lunch) Phone:  [phone]   ??? acetaminophen 325 mg tablet  ??? bisacodyl 5 mg EC tablet  ??? dexamethasone 1 mg tablet  ??? dexamethasone 1 mg tablet  ??? dexamethasone 2 mg tablet  ??? dexamethasone 4 mg tablet  ??? dexamethasone 6 mg tablet  ??? docusate 100 mg capsule  ??? ondansetron 4 mg tablet  ??? oxyCODONE 5 mg tablet  ??? pantoprazole 40 mg DR tablet  ??? prochlorperazine 25 mg suppository  ??? scopolamine 1.5 mg patch       Dex taper schedule reviewed with patient and wife. Compro suppository is hold for drug. NP and patient aware. Patient to pick up Monday if med is needed.    [name], 10/26/2017, 12:58 PM

## 2017-10-26 NOTE — Progress Notes
PATIENT: Kevin Peterson               MRN: [id number]  DOB:  [birth year]        Chief Complaint: f/u Right SSCD, left thin bony covering       ???  History of Present Illness:     HISTORY OF PRESENT ILLNESS: Kevin Peterson  is a pleasant 37 y.o. male who presents with complaints of B/L SSCD diagnosed in 2017, with symptoms reportedly beginning in [place] of 2017. Today, patient is here today for a pre-operative appointment and is scheduled for surgery tomorrow. He reports hearing his eyes move, neck movements, and notes his symptoms are more important on the RIGHT, which he believes causes his oscillopsia. He notes the most bothersome symptom is gait imbalance and most bothersome auditory symptom is pulsatile tinnitus. He reports sound distortion on the left. pt notes he cannot breathe well due to nasal blockage. Comes in with pre-operative imaging completed. Kevin Peterson is from [place] and is accompanied by his wife in clinic today. 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 No other neuro-otologic symptoms or complaints at this time.   ???  ???             Symptom Laterality: bilateral R>L             Auditory Symptoms: pulsatile tinnitus, autophony, hearing loss and internal amplification of sound             Vestibular Symptoms:  dizziness, vertigo, balance disequilibrium and oscillopsia             Other: none  ???             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             Prior Testing: outside CT Temporal Bones             Prior Ear Surgery: none  ???  SSCD Symptom Summary:  Auditory Symptoms:  Endorses/Denies   Autophony Yes   Hyperacusis No   Hearing loss Yes   Internal amplification of sound Yes   Tinnitus aurium  No   Pulsatile tinnitus  Yes   ???  Vestibular Symptoms:  Endorses/Denies   Dizziness  Yes   Noise-induced dizziness No   Vertigo Yes   Balance disequilibrium Yes   Oscillopsia Yes   ??? Other Symptoms: Endorses/Denies   Aural fullness No   Brain Fog No   Fatigue No   Headache No   Head Pressure  No   Migraines No   ???    PAST MEDICAL HISTORY: No past medical history on file.     PAST SURGICAL HISTORY: No past surgical history on file.    CURRENT MEDICATIONS:   No outpatient prescriptions have been marked as taking for the 10/23/17 encounter (Appointment) with Ralph Dowdy., MD.        ALLERGIES: Penicillins    SOCIAL HISTORY:   Social History     Social History   ??? Marital status: Married     Spouse name: N/A   ??? Number of children: N/A   ??? Years of education: N/A  Occupational History   ??? Not on file.     Social History Main Topics   ??? Smoking status: Never Smoker   ??? Smokeless tobacco: Never Used   ??? Alcohol use Yes      Comment: occasional    ??? Drug use: No   ??? Sexual activity: Not on file     Other Topics Concern   ??? Not on file     Social History Narrative   ??? No narrative on file        FAMILY HISTORY:   Family History   Problem Relation Age of Onset   ??? Anesthesia problems Neg Hx    ??? Malignant hypertension Neg Hx    ??? Hypotension Neg Hx    ??? Malignant hyperthermia Neg Hx        REVIEW OF SYSTEMS: The patient was asked and responded to a 14 point review of systems regarding the following symptoms: constitutional, eye, ears, nose, mouth, throat, cardiovascular, respiratory, gastrointestinal, genitourinary, musculoskeletal, integumentary, neurological, psychiatric, endocrine, hematologic/lymphatic, and allergic/ immunologic. The written questionnaire was reviewed, pertenent factors have been included in the HPI, and the complete list can be found in the patient's chart.    Physical Examination:    There were no vitals filed for this visit.  RR: 14 bpm  Gen: Normocephalic atraumatic   Constitutional: Well nourished, non-obese.   Eyes: Anicteric sclerae, Extra-occular movement normal and symmetric.   Ears: The detailed binocular microscopic ear exam is found in the procedure note listed below.   Respiratory: Breathing comfortably without stridor or retractions.   Cardiovascular: No jugulovenous distention or peripheral edema.   Lymphatic: No sign of cervical lymphatic malformations or adenopathy.   Musculoskeletal: No skeletal abnormalities or deformities.  Extremities: No clubbing or cyanosis.   Skin: No rashes or lesions on the head or neck.   Neurologic: Alert and oriented. Cranial nerve VII is intact and bilaterally symmetric. Gait is normal. There is no nystagmus.   Psychiatric: Non-anxious with appropriate affect.     Procedures:     Normal appearance of bilateral ears.     Review of Clinical Data:   Audiometry:   Date: 08/12/2017 (Outside)  Speech Reception Threshold RIGHT: 20 dB  Speech Reception Threshold LEFT: 15 dB  Word Recognition Score RIGHT: 100 %  Word Recognition Score LEFT: 100 %  Audiologic Diagnosis:   Results:  Audiometric testing was completed using insert earphones. Pure tone test results revealed hearing within normal limits with air?bone gaps from 250?1000 Hz bilaterally.  Speech Reception Threshold ?SRT? was obtained down to 20 dB HL in the right ear and 15 dB HL in the left ear. Speech recognition ability, obtained at the patient???s most  comfortable listening level, was 100% in the right ear and 100% in the left ear.  Tympanometry revealed normal ear canal volume, peak pressure, and compliance, bilaterally.  Audio Results 08/12/2017:  Comments/Recommendations:  1. Follow up with Gean Quint, M.D..  2. Further hearing testing in conjunction with otologic management.    ???  Radiology:   ???  >MR Brain w-wo contrast lab protocol dated on 10/23/2017:   IMPRESSION:  No evidence of infarct, intracranial hemorrhage, mass effect, or hydrocephalus. No abnormal intracranial enhancement.  PLEASE NOTE THAT THIS IS A PRELIMINARY REPORT THAT HAS NOT BEEN REVIEWED BY AN ATTENDING RADIOLOGIST.  ???  >CT Temporal Bones wo contrast dated on 10/23/2017:   IMPRESSION: RIGHT: Findings suggestive of right superior semicircular canal dehiscence.  ???LEFT: Less severe  thinning of the bony coverage of the superior semicircular canal, which may represent dehiscence in the appropriate clinical context.  PLEASE NOTE THAT THIS IS A PRELIMINARY REPORT THAT HAS NOT BEEN REVIEWED BY AN ATTENDING RADIOLOGIST.  ???  >CT Brain lab protocol wo contrast dated on 10/23/2017:   IMPRESSION:  No acute intracranial hemorrhage, hydrocephalus, or mass effect.  See accompanying CT temporal bones for evaluation of the semicircular canals.  PLEASE NOTE THAT THIS IS A PRELIMINARY REPORT THAT HAS NOT BEEN REVIEWED BY AN ATTENDING RADIOLOGIST.  ???  > Outside CT Temporal Bones dated 08/15/2017 reviewed in clinic   B/L SSCD  Right clear dehiscence, left thin bone     Vestibular Testing:   > Outside VEMP testing dated on 08/12/2017 was reviewed and noted below:  Vestibular Clinical Testing Laboratory  Patient Name: Kevin Peterson.  Gender: male Age: 37 y.o.  Medical Record Number: [id number] Date of Eval: 08/12/2017  Referring Physician: Tommy Medal  Operator: Dutch Gray Clemons  Indications for vestibular testing: Dizziness and giddiness ? R42  Vestibular Evoked Myogenic Potentials ?VEMPs?  Cervical VEMP Results  500 Hz tone burst, 125 dB SPL ?Normal range for normalized response is 1.3?3.0?  Left ear Right ear  cVEMP response amplitude ?microvolts? 307.68 342.71  Rectified EMG ?underlying muscle activation, microvolts? 127 114  Normalized response ?unitless? 2.42 3.00  Percent asymmetry 10.75  Ocular VEMP Results  500 Hz tone bursts ?Normal response range is 0?17 microvolts?  Left ear Right ear  oVEMP response amplitude ?microvolts? 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  Clinical correlation is recommended for all abnormal results. Additional information regarding interpretation of VEMP studies is at the bottom of this report.  ???????? Notes on the Vestibular Test Battery ????????  VEMP Testing notes:  Vestibular evoked myogenic potentials ?VEMPs? are a family of auditory evoked potentials or mechanically evoked potentials that indicate short?latency responses from  the otolith organs of the inner ear. Ocular VEMPs likely represent utricular function, while sound?evoked cervical VEMPS represent saccular function. Both are subject to  losses with age and with particular vestibular disorders. Testing of ocular and cervical vestibular?evoked myogenic potentials ?o? and cVEMPs? in response to either: a.  500?Hz short tone bursts ?1 ms rise/fall time, 2 ms plateau; 125 dBSPL?; b. 0.1 ms clicks ?95 dB nHL?; c. manual reflex hammer taps; or d. mini taps with a Bruel and Kjaer  Mini?Shaker Type 4810, were performed with surface electrodes overlying the left and right inferior oblique extraocular muscles for ocular recording and using surface  electrodes overlying the left and right the sternocleidomastoid muscles using an ICS Chartr EP system ?version 7.8.0?. Ranges were established in this laboratory for  healthy individuals and individuals with surgically?confirmed labyrinthine ?superior canal? dehiscence. oVEMP amplitudes are reported along with asymmetry ratio,  calculated as absolute value of 100%*?AmpRight ? AmpLeft?/ ?AmpRight + AmpLeft?. For cervical VEMPs amplitudes are also reported and normalized for underlying  muscle activity ?rectified EMG?, along with normalized asymmetry ratio, calculated as absolute value of 100%*?NormalizedAmpRight ? NormalizedAmpLeft?/  ?NormalizedAmpRight + NormalizedAmpLeft?.  General notes regarding interpretation of VEMP findings:  The following are general notes about VEMP findings:  ? Abnormally large normalized cVEMP response amplitudes or abnormally large oVEMP response amplitudes may suggest a superior canal dehiscence or other  labyrinthine fistula in the ear being tested.  ? Sound evoked VEMPs typically are reduced or absent when there is  a conductive hearing loss due to pathology at the tympanic membrane or in the middle ear.  Normal sound evoked VEMP responses in the setting of conductive hearing loss may also suggest a superior canal dehiscence or other labyrinthine fistula in the ear  being tested.  ? Absence of cVEMP responses may be due to saccule dysfunction, conductive  hearing loss, central lesion, or poor sternocleidomastoid muscle EMG tone.  ? Absence of oVEMP responses may be due to utricular dysfunction,  conductive hearing loss, central lesion, or abnormalities of oculomotor  neurons and/or extraocular muscles.      Impression/Plan:     IMPRESSION: Kevin Peterson  is a 37 y.o.  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 Surgical intervention of SSCD MCF approach repair and secondary craniotomy was discussed in clinic today. Risks, benefits, alternatives, and complications were thoroughly explained to the patient. Patient is scheduled for surgery tomorrow.     PLAN:     >I asked patient to talk to Anesthesiologist prior to surgery, regarding his breathing and nasal symptoms.   >Patient is scheduled for surgery tomorrow for RIGHT SSCD MCF approach Repair and secondary craniotomy.     Kevin Peterson was given an opportunity to ask questions, and all questions asked were answered to the best of my ability. I encouraged Kevin Peterson to email me with any further questions or concerns, or if an acute issue arises. Kevin Peterson was in agreement with this plan.      Thank you for the kind referral and for allowing me to share in the care of Kevin Peterson. If you have any questions, please do not hesitate to contact me.      Sincerely,      Radene Knee, MD.  Assistant Professor  [facility] Department of Head & Neck Surgery  [facility]    [address] [place], [address]  [place] [address]  [phone] (Appointments)  [phone] (Academic Office)  [phone] (Fax)      ATTESTATION      SCRIBE SIGNATURE(S):  I, Bethena Roys [place], am scribing for, and in the presence of, Radene Knee, MD for the documentation of Kevin Peterson on 10/25/2017 at 8:27 PM.    PHYSICIAN SIGNATURE(S):  Radene Knee, MD  10/25/2017 8:27 PM    I have reviewed this note, written by Lucia Bitter and attest that it is an accurate representation of my H & P and other events of the outpatient visit except if otherwise noted.

## 2017-10-26 NOTE — Discharge Summary
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    Brief Hospital Course: Kevin Peterson is a 37 y.o. 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 Please refer to operation report for details. The patient tolerated the operation well, without complications, and was admitted for standard postoperative management and monitoring in stable conditions. The patient was transferred to 6N unit in stable condition. The patient was started on appropriate DVT prophylaxis, a bowel regimen, a regular diet, and appropriate home medications. The patient's foley catheter was discontinued and the patient was able to void without difficulty.     Physical therapy and occupational therapy evaluated the patient and recommended: Passed NERVs    By day of discharge, the patient had a stable neurologic exam, was breathing easily on room air, had return of bowel function, was tolerating a diet, and had adequate pain control with oral medication. The patient was deemed stable for discharge.    PHYSICAL EXAM:    Vital Signs   Temp:  [36.4 ???C (97.5 ???F)-36.7 ???C (98.1 ???F)] 36.5 ???C (97.7 ???F)  Heart Rate:  [61-91] 68  Resp:  [16-23] 18  BP: (94-166)/(67-89) 115/70  NBP Mean:  [82-100] 82  SpO2:  [92 %-96 %] 95 %  On RA  ???  AAOx3  +comprehension + fluency  PERRL Pupils 3 to 2, EOMI  Face symmetric. Hearing intact bilaterally. Tongue midline.  Following commands x4  Motor: 5/5 throughout, no pronator drift  Sensation grossly intact  Incision: clean, dry, intact, no erythema or drainage  ???  Chest: symmetrical chest rise, CTA in all lobes, NSR, (-)murmur  Vascular: 2+ pulses throughout (-)edema, cyanosis  ABD: soft, nontender, flat, normoactive bowel sounds  ???  Lines and Drains  CENTRAL LINE:???None  DRAINS: None  FOLEY: None  SUTURE/STAPLE: Due 2 weeks from OR date    Lines and Drains  CENTRAL LINE: None  DRAINS: None  FOLEY: None  SUTURE/STAPLE: Due 2 weeks from OR date    Pertinent Imaging    CT temporal bone 3/21  MRI auditory canals 3/21      ASSESSMENT / PLAN  NEURO  S/p SSCD 3/21  Hx Dizziness, otologic symptoms   ??? Steroid: Decadron taper  ??? CT temporal bone and MRI auditory canals completed  ??? Suture/staple removal on POD# 14 Due date: 11/07/2017  ???  PAIN:     Post-operative pain  ??? Tylenol  ??? Ibuprofen, Dilaudid, oxycodone PRN  ??? Plan to wean off IV narcotics today  ???  RESPIRATORY  ??? Incentive spirometry  ???  CARDIOVASCULAR  Hx HTN  ??? Goal SBP 90-160  ??? Keep K >4 and Mg >2  ??? Home Lisinopril, HCTZ, Atorvastatin  ??? Heparin SQ DVT ppx BID, SCDs, attempt ambulation  ???  GU / RENAL  ??? Continue to monitor UOP  ???  ID  ??? Cefazolin while inpatient  ???  ENDOCRINE  ??? Glucose goal 100-140   ??? Glycemic control with SSI  ???  GI / HEPATIC  ??? Diet: Regular  ??? Bowel regimen  ??? Ulcer prophylaxis  ??? Vitamin D per home  ??? Aggressive PRN anti-emetics: Scopalamine, PRN Zofran, Tigan, Promethazine  ???  DISCHARGE TO: Home Discharge Condition: Stable    Diet: Regular      Discharge Instructions: Detailed Neurosurgery discharge instructions given and discussed with patient prior to discharge.    Post-Discharge Appointments:   The patient was asked to follow up with neurosurgery for a postoperative visit. Details, including time of postoperative visit, office location, and office phone number was given to the patient directly.          NEUROSURGERY DISCHARGE INSTRUCTIONS:  Please, review the following discharge instructions. They will answer many common questions patients have after surgery.    DIET: You may resume the type of diet you had before surgery, unless changed by your doctor during your stay at the hospital. Eating a well-balanced diet is important for proper wound healing.    MEDICATIONS:     Medication List      START taking these medications    acetaminophen 325 mg tablet  Commonly known as:  Tylenol  Take 2 tablets (650 mg total) by mouth every six (6) hours as needed for Pain.     bisacodyl 5 mg EC tablet  Commonly known as:  Dulcolax  Take 1 tablet (5 mg total) by mouth daily as needed for Constipation (Second line).     * dexamethasone 6 mg tablet  Take 1 tablet (6 mg total) by mouth every eight (8) hours for 3 doses.     * dexamethasone 4 mg tablet  Take 1 tablet (4 mg total) by mouth every eight (8) hours for 9 doses.  Start taking on:  10/27/2017     * dexamethasone 2 mg tablet  Take 1 tablet (2 mg total) by mouth every eight (8) hours for 9 doses.  Start taking on:  10/30/2017     * dexamethasone 1 mg tablet  Take 1 tablet (1 mg total) by mouth every eight (8) hours for 9 doses.  Start taking on:  11/02/2017     * dexamethasone 1 mg tablet  Take 1 tablet (1 mg total) by mouth daily for 3 doses.  Start taking on:  11/06/2017     docusate 100 mg capsule  Commonly known as:  Colace  Take 1 capsule (100 mg total) by mouth two (2) times daily.     ondansetron 4 mg tablet  Commonly known as:  Zofran Take 1 tablet (4 mg total) by mouth every six (6) hours as needed for Nausea or Vomiting.     oxyCODONE 5 mg tablet  Take 2 tablets (10 mg total) by mouth every four (4) hours as needed for pain     pantoprazole 40 mg DR tablet  Commonly known as:  Protonix  Take 1 tablet (40 mg total) by mouth daily.     prochlorperazine 25 mg suppository  Commonly known as:  Compazine  Unwrap and insert 1 suppository (25 mg total) rectally every twelve (12)  hours as needed for Nausea or Vomiting.     scopolamine 1.5 mg patch  Commonly known as:  Transderm-Scop  Place 1 patch onto the skin every three (3) days.  Start taking on:  10/27/2017        * This list has 5 medication(s) that are the same as other medications prescribed for you. Read the directions carefully, and ask your doctor or other care provider to review them with you.            CONTINUE taking these medications    atorvastatin 10 mg tablet  Commonly known as:  Lipitor     cholecalciferol 400 units tablet     hydroCHLOROthiazide 25 mg tablet     lisinopril 5 mg tablet  Commonly known as:  Prinivil,Zestril     multivitamin tablet           Where to Get Your Medications      These medications were sent to [facility] [phone])  [address], [place] [address]    Hours:  Mon-Fri 8AM-9PM, Sat 8AM-7PM, Sun/Holidays 8AM-5PM (Closed 1PM-2PM for lunch) Phone:  [phone]   ??? acetaminophen 325 mg tablet  ??? bisacodyl 5 mg EC tablet  ??? dexamethasone 1 mg tablet  ??? dexamethasone 1 mg tablet  ??? dexamethasone 2 mg tablet  ??? dexamethasone 4 mg tablet  ??? dexamethasone 6 mg tablet  ??? docusate 100 mg capsule  ??? ondansetron 4 mg tablet  ??? oxyCODONE 5 mg tablet  ??? pantoprazole 40 mg DR tablet  ??? prochlorperazine 25 mg suppository  ??? scopolamine 1.5 mg patch       Start taking the medications you took prior to surgery unless directed otherwise by your physician. Should you have questions about this please ask your physician prior to discharge.    Your doctor will provide you with prescriptions for the medication you are to take at home. You may fill your prescriptions at the [facility], or you can have them filled at a pharmacy closer to your home.    Before your discharge, your nurse will review with you and write down your medication dosage, schedule, and side effects. It is important to take your medications as ordered and try to stay on schedule.    Do NOT take ibuprofen, aspirin, coumadin, xarelto or any other anti-platelet or anti-coagulation medication unless instructed to do so by your surgeon.    If you are unsure if a medication is safe, ask your physician and/or pharmacist.    COMFORT AND PAIN MANAGEMENT  It is common to have a headache and/or pain after surgery. This may last a few days or a few weeks. You will have pain medications prescribed by your doctor for your pain management. The medication may be irritating to the stomach lining, it is advisable to take it with a teaspoon of applesauce or non-fat yogurt.    Pain medication (narcotics) may cause constipation. A high-fiber diet may help  if this occurs. If your symptoms do not resolve, use a stool softener or over the counter gentle laxative. If the medications are ineffective, call your doctor???s office to discuss on-going pain management.    If you should become constipated there are many agents that you can obtain from any pharmacy over the counter. These include:  ??? For mild constipation: Colace, Senna, Milk of Magnesium, Miralax, Dulcolax tablet. ???    ??? For moderate constipation: Milk of Magnesium, Dulcolax suppository ???  ???  For severe constipation: Please speak to your primary care physician.     You may also experience nightmares, hallucinations and sweating from your pain medications. Please let you doctor know if these symptoms occur.    Some medications such as Percocet, Vicodin and Norco contain Tylenol (acetaminophen). Avoid taking these medications more frequently then directed; too much Tylenol can cause liver damage. Do NOT take over-the-counter products that contain Tylenol/Acetaminophen if you are already on these medications.    DO NOT drive while taking narcotics! ???    Eye/facial swelling is common after surgery and may take a few days to a week to disappear. Bruising may occur and will take one to two weeks to resolve. You may feel better if you sleep with two pillows under your head; keeping your head elevated will help reduce facial swelling.    OVERVIEW OF DAILY ACTIVITIES:  You may feel more tired for 1-3 weeks after surgery. Get plenty of rest.     WALKING: Increase your activities slowly.  Start with a gradual walking program of 5-10 minutes 2-4 times a day.  Every few days, try to increase each session by a few minutes. You should walk every day. Walking will improve circulation, increase your feeling of well-being, and prevent pulmonary problems.    LIFTING: Do not lift more than 10lbs for at least 2 weeks or until your surgeon tells you to do so. Do not participate in sports or activities that increase your risk for head injury such as contact sports, bike riding, soccer, football, skateboarding.    OTHER ACTIVITIES: Minimize activities that require you to bend your head downward (may increase headache) such as yoga, bending forward at the waist to pick up objects. Due to seizure risk, do not swim or hike alone. Avoid straining when having a bowel movement. Take laxatives as prescribed.    When you see your surgeon in the follow-up appointment, he or she will discuss decreasing the limits on activity at that time. You may resume sexual intimacy when you feel well enough, but do not overexert yourself. You must have clearance from your doctor before participating in any strenuous exercises/activity.    The brain will take roughly 2-6 weeks to heal. You may be very fatigued during this time. You may hear popping or dripping noises inside your head, but this is just a result of air and fluid reabsorbing after your surgery.    RESUME WORK / DRIVING / AIR TRAVEL:  You must have clearance from your doctor before returning to work, driving a car, or flying. This will be discussed at your postoperative visit. Do not drive while you are on prescription pain medications.     WOUND / SUTURE CARE:  Keep incision clean and dry at all times. Your incision will have either staples or sutures.    BATHING/INCISIONAL CARE: You may shower or bathe within 24 hours after surgery, however do not get your incision(s) wet until 4 days after surgery. We recommend that you shower with someone in the bathroom to assist you. Wash, do not scrub your incision. Pat dry the incisions after showering. Do not immerse your incisions in standing bodies of water (bathtub, pool, ocean) for 4 weeks or until cleared by your surgeon at your follow up appointment. Your incision may be open to air; however you may cover the incision with a clean cap, scarf or hat.    SURGICAL DRESSINGS AND SITE CARE: You may remove your head wrap and/or dressings unless  already removed or otherwise instructed 48 hours after surgery. There will likely be dried blood on the wrap and/or dressing. Your incision will have either staples or sutures. Keep the surgical incision clean and dry. You do not need to cover up the dressing at this point. If you have an abdominal or thigh incision, keep area clean and open to air. Cover with plastic wrap before showering. The ???Steri-strips??? on the incision will come off on their own. You may gently wash your abdominal or thigh incision with soap and water 4 days after your surgery.    STAPLE / SUTURE REMOVAL: You will be asked to return to the [facility] Neurosurgery Suture Removal Clinic in 10-14 days for removal (however 14-21 days if this is a second surgery going through the same skin incision). You will be provided with a handout explaining when and where to go. If you live far from [facility], you may have you primary care physician remove your sutures/staples. Please discuss this with your physician and his team while in hospital.    Neurosurgery Suture Clinic: Every Monday from 2:00PM ??? 3:30PM.  [facility], [address], [address]   [place], [place] [address]  Phone: [phone]    Directions to Suture Clinic  From [facility] turn [place] onto [address]. Continue driving NORTH on [address] passing [employer]. Turn RIGHT at the next signal light onto [address]. Continue down the [employer] driveway to [employer] parking. Parking is $12.00. Disabled parking is $5.00. The office is located directly across the street from [facility]. See map below.      SIGNS TO WATCH FOR AT HOME:  Always try to call your doctor first if you are experiencing any of the symptoms below:  - Onset of severe, persistent headache not relieved by medication and rest.  - Onset of increased drowsiness, confusion, stiff neck, or nausea and vomiting.  - Any new onset or worsening of visual problems.  - Any new onset or worsening of speech problems.  - Any new onset or worsening of swallowing problems.  - Any new onset of or increased weakness, numbness or tingling.  - Any new onset of or increased seizures.  - New weakness or sensory changes  - Fever>101o F  - Shortness of breath  - Chest pain  - Calf/leg pain  - Redness, warmth, swelling or unusual drainage from wound(s)  - Bright red or excessive drainage from the incision(s)  - Drainage that is foul-smelling  - Stitches become loose    For life-threatening emergencies that cannot wait, please go to the nearest Emergency Room for immediate evaluation or dial 911.    HOW TO REACH [place]:  We are available to you at all times.     During business hours, please call [facility]: [phone]. After 5:00 PM, please call the [facility] page operator: [phone]. Ask to have the neurosurgical resident on call contacted for urgent questions.    REHABILITATION NEEDS:  If indicated, our rehabilitation professional will assess you prior to your discharge. We will order any rehabilitation needs and equipment prior to your discharge.    FOLLOW-UP APPOINTMENTS    ? Follow up with your primary care physician in 1 week for any health maintenance needs.  ? Sutures/staples may be removed in 10-14 days on or about 11/07/2017 by your primary care physician, at the [facility] Neurosurgery Suture Clinic on 11/04/2017, or at your neurosurgery follow-up appointment.  ? Please call your neurosurgeon, Dr.  Threasa Beards, at [phone] to make your follow up appointment in 1-2 weeks.   ? Dr. Baird Cancer, your head and neck surgeon, can be reached at [phone] for any questions or concerns.   ? We recommend outpatient vestibular rehabilitation. Please contact your primary care physician or your insurance company to help you coordinate an appropriate facility. Present them the triplicate form given to you at time of discharge.   ? You currently have the following appointments already scheduled:     Future Appointments  Date Time Provider Department Center   10/30/2017 2:00 PM Gopen, Estil Daft., MD [facility] RAD ONC NEUROSURGERY     I spent >30 minutes examining the patient, preparing discharge documents including: discharge records, prescriptions, referral forms, instructions, and communicating discharge plan with the patient, family, and all relevant care providers.    The patient was seen and examined by the Neurosurgery team. Above plan was discussed with neurosurgery resident team and attending physician who were in agreement with the plan.    Author: Maceo Pro, NP  10/26/2017 7:10 AM  Department of Neurosurgery

## 2017-10-27 LAB — Vitamin D,1,25-DiOH: VITAMIN D,1,25-DIOH: 56.7 pg/mL (ref 19.9–79.3)

## 2017-10-27 MED FILL — DOK 100 MG PO CAPS: 100 mg | ORAL | 30 days supply | Qty: 60 | Fill #0

## 2017-10-29 ENCOUNTER — Telehealth: Payer: PRIVATE HEALTH INSURANCE

## 2017-10-29 ENCOUNTER — Other Ambulatory Visit: Payer: Self-pay | Admitting: *Deleted

## 2017-10-29 NOTE — Telephone Encounter
Call Back Request    MD: Dr. Threasa Beards,    Reason for call back: Pt's wife called in requesting to speak with Dr. Threasa Beards, pt has been experiencing ringing on his right ear since Sunday.  CB# [phone]    Any Symptoms:  [x]  Yes  []  No      ? If yes, what symptoms are you experiencing:  Ringing on right ear   o Duration of symptoms (how long):  Since Sunday  o Have you taken medication for symptoms (OTC or Rx):  His current medciations    Patient or caller has been notified of the 24-48 hour turnaround time.  Thank you!

## 2017-10-29 NOTE — Telephone Encounter
Spoke with patient's wife.   Patient's wife expressed frustration that the patient was still experiencing ringing in his ears despite the rest of the pre-surgery noises subsiding.  Advised her that his symptoms will take time to resolve.

## 2017-10-29 NOTE — Patient Outreach (Signed)
[facility] Care Management  10/29/2017  Justin CoddingtonDaniel Rush [birth year] [id number]  Subjective: Telephone call to patient's home  / mobile number, no answer, left HIPAA compliant voicemail message, and requested call back.     Objective: Per KPN (Knowledge Performance Now, point of care tool) and chart review, patient hospitalized 10/24/17 -10/26/17 [facility]   Patient also has a history of high cholesterol (hyperlipidemia) and hypertension.     Assessment: Received [employer] Transition of care referral on 10/29/17.   Transition of care follow up pending patient contact.      Plan: RNCM will call patient for 2nd telephone outreach attempt, transition of care follow up, within 10 business days if no return call.      Taiven Greenley H. Gardiner Barefootooper RN, BSN, CCM [facility] Telephonic CM Phone: [phone] Fax: [phone]

## 2017-10-30 ENCOUNTER — Ambulatory Visit: Payer: PRIVATE HEALTH INSURANCE

## 2017-10-30 ENCOUNTER — Encounter: Payer: Self-pay | Admitting: *Deleted

## 2017-10-30 ENCOUNTER — Ambulatory Visit: Payer: Self-pay | Admitting: *Deleted

## 2017-10-30 ENCOUNTER — Other Ambulatory Visit: Payer: Self-pay | Admitting: *Deleted

## 2017-10-30 DIAGNOSIS — H838X1 Other specified diseases of right inner ear: Secondary | ICD-10-CM

## 2017-10-30 DIAGNOSIS — Z9889 Other specified postprocedural states: Secondary | ICD-10-CM

## 2017-10-30 NOTE — Progress Notes
Kevin: Kevin Peterson               MRN: [id number]  DOB:  01-31-1981        Chief Complaint: s/p RIGHT SSCD repair and secondary craniotomy 10/24/2017       Subjective:     Kevin Peterson is a pleasant 37 y.o. male with a history of RIGHT SSCD, Left thin bone who comes in today for a follow up status post RIGHT SSCD repair and secondary craniotomy MCF approach performed on 10/24/2017. Today, he reports ear popping, pressure, fatigue, and sleeping difficulty due to postop steroids use. He also reports bleeding around area of the Right postauricular incision during the first postop night but this has since decreased. He is going to take 2 mg of postoperative steroid tapers today. He did have some tinnitus aurium postoperatively, though notes his pulsatile tinnitus is resolved postop. His wife notes that his balance has been improved from pre-operative state. Kevin states he is otherwise doing well with no other otologic complaints. Kevin Peterson denies aural discharge, otorrhagia and fever.        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    LEFT: thin bone overlying superior canal     Pre-operative Symptom Laterality: bilateral R>L   Pre-operative Auditory Symptoms: pulsatile tinnitus, autophony, hearing loss and internal amplification of sound   Pre-operative Vestibular Symptoms:  dizziness, vertigo, balance disequilibrium and oscillopsia   Other Pre-operative Symptoms: none    SSCD Pre-Operative Symptom Summary  Pre-Operative Symptom Laterality: bilateral, R>L  Auditory Symptoms:  Endorses/Denies   Autophony Yes   Hyperacusis No   Hearing loss Yes   Internal amplification of sound Yes   Tinnitus aurium  No   Pulsatile tinnitus  Yes       Vestibular Symptoms:  Endorses/Denies   Dizziness  Yes   Noise-induced dizziness No   Vertigo Yes   Balance disequilibrium Yes   Oscillopsia Yes       Other Symptoms: Endorses/Denies   Aural fullness No   Brain Fog No   Fatigue No Headache No   Head Pressure  No   Migraines No         Physical Examination:   Gen: Normocephalic atraumatic   Constitutional: Well nourished, non-obese.   Eyes: Anicteric sclerae, Extra-occular movement normal and symmetric.   Ears: The detailed binocular microscopic ear exam is found in the procedure note listed below.   Respiratory: Breathing comfortably without stridor or retractions.   Cardiovascular: No jugulovenous distention or peripheral edema.   Lymphatic: No sign of cervical lymphatic malformations or adenopathy.   Musculoskeletal: No skeletal abnormalities or deformities.  Extremities: No clubbing or cyanosis.   Skin: No rashes or lesions on the head or neck.   Neurologic: Alert and oriented. Cranial nerve VII is intact and bilaterally symmetric. Gait is normal. There is no nystagmus.   Psychiatric: Non-anxious with appropriate affect.     Review of Clinical Data:   Audiometry:   Date: 08/12/2017 (Outside)  Speech Reception Threshold RIGHT: 20 dB  Speech Reception Threshold LEFT: 15 dB  Word Recognition Score RIGHT: 100 %  Word Recognition Score LEFT: 100 %  Audiologic Diagnosis:   Results:  Audiometric testing was completed using insert earphones. Pure tone test results revealed hearing within normal limits with air?bone gaps from 250?1000 Hz bilaterally.  Speech Reception Threshold ?SRT? was obtained down to 20 dB HL in the right ear and 15 dB HL  in the left ear. Speech recognition ability, obtained at the Kevin???s most  comfortable listening level, was 100% in the right ear and 100% in the left ear.  Tympanometry revealed normal ear canal volume, peak pressure, and compliance, bilaterally.  Audio Results 08/12/2017:  Comments/Recommendations:  1. Follow up with Gean Quint, M.D..  2. Further hearing testing in conjunction with otologic management.    Imaging:     > CT Temporal Bones Wo Contrast: 10/25/2017  CT TEMPORAL BONES CLINICAL HISTORY: 37 years Male; ''Post op'' COMPARISON: CT temporal bones dated October 22, 2017. TECHNIQUE: 3-D volumetric imaging of the temporal bones was performed. Multiplanar reformats were provided. RADIATION DOSE: The Kevin received the following exposure event(s) during this study, and the dose reference values for each are as shown (CTDIvol in mGy, DLP in mGy-cm). Note that the values are not Kevin dose but numbers generated from scan acquisition factors based on 32 cm (body, ''a'') and/or 16 cm (head, ''b'') phantoms and may substantially under-estimate or over-estimate actual Kevin dose based on Kevin size and other factors. Temporal Bone (42b, 349);  (); FINDINGS: There has been interval right temporal craniotomy and placement of a bone plug along the petrous bone, consistent with repair of superior semicircular canal dehiscence.  Surgical packing material and a bone plug are noted along the floor of the right middle cranial fossa/tegmen tympani. There is expected postsurgical air along the surgical site. New fluid/hemorrhage within the right mastoid air cells and middle ear cavity. The temporal bone structures are otherwise unchanged from the recent prior CT scan.     IMPRESSION:  Interval right temporal craniotomy for repair of superior semicircular canal dehiscence, with expected postsurgical findings. I, Rayfield Citizen, MD, have reviewed this study and agree with the report above. Signed by: Rayfield Citizen   10/25/2017 11:19 AM    >MR Brain w-wo contrast lab protocol dated on 10/23/2017:   IMPRESSION:  No evidence of infarct, intracranial hemorrhage, mass effect, or hydrocephalus. No abnormal intracranial enhancement.  PLEASE NOTE THAT THIS IS A PRELIMINARY REPORT THAT HAS NOT BEEN REVIEWED BY AN ATTENDING RADIOLOGIST.  ???  >CT Temporal Bones wo contrast dated on 10/23/2017:   IMPRESSION:  RIGHT: Findings suggestive of right superior semicircular canal dehiscence.  ???LEFT: Less severe thinning of the bony coverage of the superior semicircular canal, which may represent dehiscence in the appropriate clinical context.  PLEASE NOTE THAT THIS IS A PRELIMINARY REPORT THAT HAS NOT BEEN REVIEWED BY AN ATTENDING RADIOLOGIST.  ???  >CT Brain lab protocol wo contrast dated on 10/23/2017:   IMPRESSION:  No acute intracranial hemorrhage, hydrocephalus, or mass effect.  See accompanying CT temporal bones for evaluation of the semicircular canals.  PLEASE NOTE THAT THIS IS A PRELIMINARY REPORT THAT HAS NOT BEEN REVIEWED BY AN ATTENDING RADIOLOGIST.  ???  >Outside CT Temporal Bones dated 08/15/2017 reviewed in clinic   B/L SSCD  Right clear dehiscence, left thin bone     Vestibular Testing:   ???  >Outside VEMP testing dated on 08/12/2017 was reviewed and noted below:  Vestibular Clinical Testing Laboratory  Kevin Peterson.  Gender: male Age: 37 y.o.  Medical Record Number: [id number] Date of Eval: 08/12/2017  Referring Physician: Tommy Medal  Operator: Dutch Gray Clemons  Indications for vestibular testing: Dizziness and giddiness ? R42  Vestibular Evoked Myogenic Potentials ?VEMPs?  Cervical VEMP Results  500 Hz tone burst, 125 dB SPL ?Normal range for normalized response is 1.3?3.0?  Left ear  Right ear  cVEMP response amplitude ?microvolts? 307.68 342.71  Rectified EMG ?underlying muscle activation, microvolts? 127 114  Normalized response ?unitless? 2.42 3.00  Percent asymmetry 10.75  Ocular VEMP Results  500 Hz tone bursts ?Normal response range is 0?17 microvolts?  Left ear Right ear  oVEMP response amplitude ?microvolts? 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  Clinical correlation is recommended for all abnormal results. Additional information regarding interpretation of VEMP studies is at the bottom of this report.  ???????? Notes on the Vestibular Test Battery ????????  VEMP Testing notes: Vestibular evoked myogenic potentials ?VEMPs? are a family of auditory evoked potentials or mechanically evoked potentials that indicate short?latency responses from  the otolith organs of the inner ear. Ocular VEMPs likely represent utricular function, while sound?evoked cervical VEMPS represent saccular function. Both are subject to  losses with age and with particular vestibular disorders. Testing of ocular and cervical vestibular?evoked myogenic potentials ?o? and cVEMPs? in response to either: a.  500?Hz short tone bursts ?1 ms rise/fall time, 2 ms plateau; 125 dBSPL?; b. 0.1 ms clicks ?95 dB nHL?; c. manual reflex hammer taps; or d. mini taps with a Bruel and Kjaer  Mini?Shaker Type 4810, were performed with surface electrodes overlying the left and right inferior oblique extraocular muscles for ocular recording and using surface  electrodes overlying the left and right the sternocleidomastoid muscles using an ICS Chartr EP system ?version 7.8.0?. Ranges were established in this laboratory for  healthy individuals and individuals with surgically?confirmed labyrinthine ?superior canal? dehiscence. oVEMP amplitudes are reported along with asymmetry ratio,  calculated as absolute value of 100%*?AmpRight ? AmpLeft?/ ?AmpRight + AmpLeft?. For cervical VEMPs amplitudes are also reported and normalized for underlying  muscle activity ?rectified EMG?, along with normalized asymmetry ratio, calculated as absolute value of 100%*?NormalizedAmpRight ? NormalizedAmpLeft?/  ?NormalizedAmpRight + NormalizedAmpLeft?.  General notes regarding interpretation of VEMP findings:  The following are general notes about VEMP findings:  ? Abnormally large normalized cVEMP response amplitudes or abnormally large oVEMP response amplitudes may suggest a superior canal dehiscence or other  labyrinthine fistula in the ear being tested.  ? Sound evoked VEMPs typically are reduced or absent when there is a conductive hearing loss due to pathology at the tympanic membrane or in the middle ear.  Normal sound evoked VEMP responses in the setting of conductive hearing loss may also suggest a superior canal dehiscence or other labyrinthine fistula in the ear  being tested.  ? Absence of cVEMP responses may be due to saccule dysfunction, conductive  hearing loss, central lesion, or poor sternocleidomastoid muscle EMG tone.  ? Absence of oVEMP responses may be due to utricular dysfunction,  conductive hearing loss, central lesion, or abnormalities of oculomotor  neurons and/or extraocular muscles.    Procedures:   Normal appearance of bilateral ears.    RIGHT:  Postauricular incision was clean and no pus or erythema was present.   Staples in place.     Impression/Plan:     IMPRESSION: Kevin Peterson is a 37 y.o. male with a history of RIGHT SSCD, left thin bone who underwent RIGHT SSCD Repair and secondary craniotomy MCF approach performed on 10/24/2017, and is having an uneventful recovery with resolved pulsatile tinnitus and improved balance functioning. Kevin is still on the postop Dexamethasone taper. Upon physical exam, his right-sided postauricular incision is clean, dry, and intact with no evidence of pus or erythema and  surgical staples are in place. Kevin did sustain bleeding, I recommended the Kevin to apply topical ointment to the right postauricular incision site as it continues to scab and heal. Based upon history and physical exam, the Kevin appears to be healing well without evidence for concern. Updated audiometry in 6  Weeks from today is recommended to quantify the Kevin's current hearing sensitivity.       PLAN:     >I recommended staple removal with his local PCP.   >I advised him to practice dry ear precautions by applying a cotton ball placed in Vaseline into the ear prior to showering.   >Kevin Peterson was instructed to return to clinic as needed or for any signs of infection including increased aural drainage, fever and aural swelling.  >Follow up in 6 weeks from today with postoperative audiogram.       Kevin Peterson was given an opportunity to ask questions, and all questions asked were answered to the best of my ability. I encouraged Kevin Peterson to email me with any further questions or concerns, or if an acute issue arises. Kevin Peterson was in agreement with this plan.      Thank you for the kind referral and for allowing me to share in the care of Kevin Peterson. If you have any questions, please do not hesitate to contact me.      Sincerely,      Radene Knee, MD.  Assistant Professor  [facility] Department of Head & Neck Surgery  Ear and [facility]    [address] [place], [address]  [place] [address]  [phone] (Appointments)  [phone] (Academic Office)  [phone] (Fax)      ATTESTATION      SCRIBE SIGNATURE(S):  I, Bethena Roys Hastings, am scribing for, and in the presence of Radene Knee, MD with the documentation for Kevin Peterson on 10/30/2017 at 2:06 PM.    PHYSICIAN SIGNATURE(S):  Radene Knee, MD 10/30/2017 2:06 PM    I have reviewed this note, written by Lucia Bitter and attest that it is an accurate representation of my H & P and other events of the outpatient visit except if otherwise noted.

## 2017-10-30 NOTE — Op Note
DATE OF OPERATION:  10/24/2017      PREOPERATIVE DIAGNOSIS:  Right skull base lesion      POSTOPERATIVE DIAGNOSIS:  Right skull base lesion      NAME OF OPERATION:  1. Apprach to skull base, middle cranial fossa.   Including: right subtemporal craniotomy via infratemporal pre-auricular approach (81191)  2.  Intraoperative use of microscope for microdissection. [phone])  3.  Intraoperative neuronavigation with BrainLAB navigational for extra dural pre-auricular infratemporal neuro navigation image guidance (21308)      SURGEON:  Radene Knee, MD [phone])      ANESTHESIA:  General endotracheal.      INDICATIONS:   Kevin Peterson is a 37 y.o. male with who presented with signs and symptoms consistent with a skull base lesion for skull base approach so it was requested a minimally invasive key hole pre-auricular infratemporal craniotomy for approach to this skull base lesion. Following this request for an approach surgery for this skull base lesion, I performed an infratemporal pre auricular approach for a stereotactic right subtemporal craniotomy for middle fossa craniotomy for a pre auricular infratemporal approach craniotomy for the skull base lesion.    SURGICAL RISK:   The patient was well apprised of the limitations, alternatives, risks, goals, and benefits of the procedure including the objectives, benefits, risks, and potential complications of the surgery which were including but not limited to, worsening of current status, the possible need for further procedures, the risk of infection, headaches, CSF leak, seizures, hemorrhage, stroke, loss of language function, paralysis, coma, and even death. Informed consent was obtained and secured in the chart after the patient and the patient voiced understanding of the limitations, alternatives, risks, goals, and benefits of the surgery and requested that we proceed with the operation.     DIAGNOSTIC STUDIES: The patient had a preoperative CT scan, which showed the skull base lesion.    NOTE ON COMPLEXITY:    This was a highly complex skull base operation. This surgery required a minimally invasive keyhole pre auricular infratemporal cranial approach with dissection of the zygoma, external auditory meatus while preserving the anatomical planes and a middle fossa floor dissection while carefully elevating the temporal lobe. This required a careful skull base approach to the midlle cranial fossa with dissection of the floor of the middle fossa and the petrous ridge. Identifying this skull base lesion on the middle fossa floor petrous bone required meticulous high magnification separation of the dura and brain carefully elevated off the middle fossa floor and the arcuate emminence. This was a complex skull base operation requiring high magnification microdissection. Careful bipolar of the dura was required to identify this skull base lesion.   The complexity was further elevated by the patient's co morbidities. This patient  has no past medical history on file.Marland Kitchen    PREOPERATIVE MEDICATIONS:   Preoperative antibiotics were administred within one hour of incision.     DVT PROPHYLAXIS:   Sequential Compression Devices were applied prior to skin incision.    DESCRIPTION OF PROCEDURE:   The patient was transferred to the Operating Room, and was given preoperative prophylactic IV antibiotics.    The patient was sedated and intubated without difficulty by the Anesthesia Service, and the eyelids were taped shut after ointment was applied to prevent corneal abrasion. A Bair Hugger was placed over the exposed lower body to maintain control of the core body temperature. A Foley catheter was inserted.    A Mayfield head clamp was applied. The patient  was positioned in the supine position with a sholdour bump and the head turned approximately 35 degrees to the right with the right side exposed with very mild flexion. All pressure points were carefully padded. The hair was clipped over the area where the patient was to undergo the incision.    Preprepping was done with chlorhexidine solution. The electrophysiological monitoring was done with cranial nerve monitoring of the facial nerve and BAERs with needles in their proper locations, and baseline SSEP and EEG evoked potentials  And 7th nerve facial and 8th BAERs  monitoring were obtained.    The stereotactic CT which was done previously had its images transferred to the neuronavigational system. Next, a 3-dimensional images were reconstructed, and the patient underwent core registration of the preoperative stereotactic CT with surface landmarks. Accuracy was within normal limits.    Vital structures including the transverse and sigmoid sinus were identified and mapped out and the planned pre auricular infratemporal approach craniotomy was outlined.     OPERATIVE TECHNIQUE:    The patient was then prepped and draped in the standard sterile fashion. A pre auricualr infratemporal incision in the shape of an inverted L  incision was opened sharply with a #10 scalpel blade to the level of the periosteum. Subcutaneous dissection was performed sharply with the Metzenbaum scissors sacrificing the superficial temporal artery and preserving some of its branches.    Monopolar electrocautery was used to dissect down to the periosteum and down to the bone. The superficial temporalis muscle was dissected posterior on the zygomatic arch to preserve the frontalis branch of the facial nerve. The skin was reflected laterally and inferiorly in a myocutaneous flap. Once the dissection approached the auditory canal, Penfield 1 was used for blunt dissection.   The pre auricular infratemporal craniotomy for approach to skull base lesion for ENT was performed. The zygomatic arch was then carefully identified and dissected out. The zygoma had to be completed dissected for the identification and approach to this infratemporal preauricular apporach surgery.   The zygomatic arch was then followed posteriorly to identify the external auditory canal. Using the zygomatic arch and the external auditory canal as landmarks, a pre auricular infratemporal craniotomy was performed. This craniotomy was focused to utilize a minimally invasive key hole pre auricular infratemporal apporach to this skull base lesion.   Using a high-speed pneumatic drill with an acorn burr hole, burr holes were made in each corner of this craniotomy. The dura was then dissected free on burr holes with the B1 bit and footplate. The burr holes were then interconnected and craniotomy was completed. The bone flap was freed using a #3 penfield and was reflected and was removed.   The dura was visualized, and the dura was completely  intact.   Mannitol was given at the beginning of the case and the dura was seen to be not tense.      Hemostasis was achieved utilizing a combination of bipolar electrocautery as well as Gelfoam. The temporal bone of the middle fossa, the bone above the external auditory canal, and the temporal bone were then shaved down exposing the temporalis air cells. These air cells were waxed during the exposure and also waxed again during the closure. Using bone wax to the temporalis air cells which were exposed with a shave down for the middle fossa approach were then waxed. Hemostasis was achieved utilizing a combination of bipolar electrocautery as well as Gelfoam. The wound was irrigated until clear.    At this  point, the intraoperative microscope was draped with sterile operating drapes and brought into the operating field utilizing microdissection. Using microdissection and the BRAIN LAB Neuro Navigation, the dura was carefully dissected off the petrous bone dissected down to the medial portion of the petrous apex to identify the skull base lesion in this pre auricular infratemporal corridor approach to this skull base lesion for approach surgery.    Under careful microdissection of the skull base middle fossa floor, we identified the arcuate emminence and using this pre auricular infratemporal approach surgery to identify this skull base lesion.     Dr. Darol Destine performed the definitve procedure.  Once the other attending scrubbed in, I scrubed out as the approach surgery had been completed. Please see the seperate dictation, by Dr. Darol Destine , for description of the definitive procedure.     After the  definitive procedure for this skull base lesion, we then performed the closure and, the wound was irrigated with copious amounts of irrigation. Surgifoam was used to achieve hemostasis. The craniotomy defect was replaced and fixed with miniplates and screws. The wound was then irrigated with antibiotic solution. Tisseel was sprayed into the gaps of the temporalis middle fossa region. The temporalis muscle was then reapproximated using Vicryl sutures. The galea was then reapproximated utilizing 2-0 Vicryl sutures. The skin was then closed with staples. A sterile head dressing was placed over the closed head wounds. All needle counts, sponge counts, and instrument counts were correct at the end of the case x2. The patient tolerated the procedure well and was transferred to the Recovery Room in stable condition.       FINDINGS:  The patient had a pre-auricular infratemporal approach to identify an skull base lesion for approach surgery.    DRAINS:   No drains.     COMPLICATIONS:  None.      ESTIMATED BLOOD LOSS:  Estimated blood loss for the approach portion of the surgery was less than 20cc.      SPECIMENS:  None from the approach portion of the surgery.    DISPOSITION:   Stable, extubated to PACU.           Radene Knee, MD  Assistant Professor of Otology and Skull base Surgery  Department of Head & Neck Surgery at [facility], [address] [place] [address]   [phone] (Appointments)  [email or web address]

## 2017-10-30 NOTE — Patient Outreach (Signed)
[facility] Care Management  10/30/2017  Justin CoddingtonDaniel Rush 1981-03-02 [id number]  Subjective: Telephone call to patient's home  / mobile number, no answer, left HIPAA compliant voicemail message, and requested call back.     Objective: Per KPN (Knowledge Performance Now, point of care tool) and chart review, patient hospitalized 10/24/17 -10/26/17 [facility]   Patient also has a history of high cholesterol (hyperlipidemia) and hypertension.     Assessment: Received [employer] Transition of care referral on 10/29/17.   Transition of care follow up pending patient contact.      Plan: RNCM will send unsuccessful outreach  letter, [facility], will call patient for 3rd telephone outreach attempt, transition of care follow up, and proceed with case closure, within 10 business days if no return call.      Denyla Cortese H. Gardiner Barefootooper RN, BSN, CCM [facility] Telephonic CM Phone: [phone] Fax: [phone]

## 2017-11-09 ENCOUNTER — Ambulatory Visit: Payer: Commercial Managed Care - Pharmacy Benefit Manager

## 2017-11-12 ENCOUNTER — Other Ambulatory Visit: Payer: Self-pay | Admitting: *Deleted

## 2017-11-12 ENCOUNTER — Ambulatory Visit: Payer: Self-pay | Admitting: *Deleted

## 2017-11-12 NOTE — Patient Outreach (Signed)
[facility]) Care Management  11/12/2017  Justin CoddingtonDaniel Rush 01-18-1981 [id number]   Subjective:Telephone call to patient's home / mobile number, no answer, left HIPAA compliant voicemail message, and requested call back.     Objective:Per KPN (Knowledge Performance Now, point of care tool) and chart review,patient hospitalized 10/24/17 -10/26/17 [facility] Patient also has a history of high cholesterol (hyperlipidemia) and hypertension.     Assessment:Received [employer] Transition of care referral on 10/29/17. Transition of care follow up not completed due to unable to contact patient and will proceed with case closure.       Plan:Case closure due to unable to reach.       Justin Musquiz H. Gardiner Barefootooper RN, BSN, CCM [facility] Telephonic CM Phone: [phone] Fax: [phone]

## 2017-11-12 NOTE — Patient Outreach (Signed)
[facility]) Care Management  11/12/2017  Justin Rush Bayle [birth year] [id number]  Subjective: Received voicemail message from Mr. Justin Rush Griffee states he is returning call, and requested call back.  Telephone call to patient's home number, spoke with patient, and HIPAA verified.  [facility] [employer] Transition of care follow up, patient voiced understanding, and is in agreement to follow up.    Patient states he is doing well, has returned to work, had a follow up appointment with primary MD on 11/06/17, verified hospitalization dates, and the appointment went well.  Brief transition of care follow up assessment completed per patient's request, only has a few minutes to talk, and is on his way to a meeting.  Patient states he is able to manage self care and has assistance as needed with activities of daily living / home management.   Patient voices understanding of medical diagnosis and treatment plan.  States he is accessing her [employer] Auerbach[employer]igna benefits as needed via member services number on back of card or through [email or web address].   Patient states he does not have any education material, transition of care, care coordination, disease management, disease monitoring, transportation, community resource, or pharmacy needs at this time.   States he is very appreciative of the follow up and is in agreement to receive [facility] information.     Objective:Per KPN (Knowledge Performance Now, point of care tool) and chart review,patient hospitalized 10/24/17 -10/26/17 for other disease inner ear at [facility]. Patient also has a history of high cholesterol (hyperlipidemia) and hypertension.     Assessment:Received [employer] Transition of care referral on 10/29/17. Transition of care follow up completed, no care management needs, and will proceed with case closure.       Plan:RNCM will send patient successful outreach letter, [facility] pamph[facility], and  magnet. RNCM will update case closure due to follow up completed / no care management needs.        Sarabelle Genson H. Gardiner Barefootooper RN, BSN, CCM [facility] Telephonic CM Phone: [phone] Fax: [phone]

## 2018-01-08 ENCOUNTER — Ambulatory Visit: Payer: PRIVATE HEALTH INSURANCE

## 2018-02-12 ENCOUNTER — Ambulatory Visit: Payer: Commercial Managed Care - Pharmacy Benefit Manager

## 2018-07-02 IMAGING — MR MR HEAD WO/W CM
12 of 13 series · 39 of 48 positions shown · IV contrast (multihance)
Comparison: None.

CLINICAL DATA: Dizziness and fatigue for 2 months.

EXAM:
MRI HEAD WITHOUT AND WITH CONTRAST
TECHNIQUE: Multiplanar, multiecho pulse sequences of the brain and surrounding
structures were obtained without and with intravenous contrast.
CONTRAST:  17mL MULTIHANCE GADOBENATE DIMEGLUMINE 529 MG/ML IV SOLN

[Series 2: T1 · sagittal · 5.0mm · 0.45mm/px · 1 of 21 slices shown]
[im 1/21]
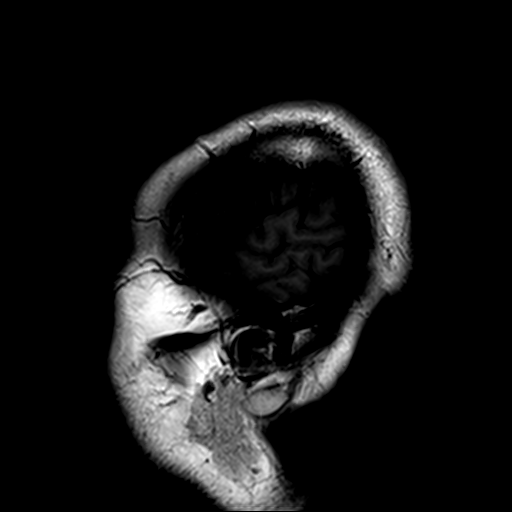

[Series 3: DWI · axial · 3.0mm · 1.80mm/px · z∈[-20,+127]mm · 5 of 100 slices shown (1 of 4)]
[im 1/100]
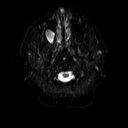
[im 25/100]
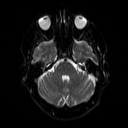
[im 50/100]
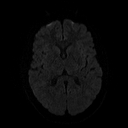
[im 75/100]
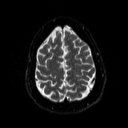
[im 100/100]
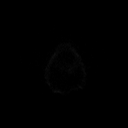

[Series 4: DWI · axial · 3.0mm · 1.80mm/px · z∈[-20,+127]mm · 3 of 50 slices shown (2 of 4)]
[im 1/50]
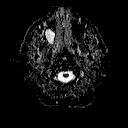
[im 25/50]
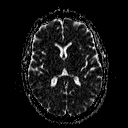
[im 50/50]
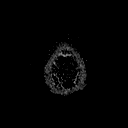

[Series 5: DWI · coronal · 5.0mm · 1.80mm/px · 4 of 62 slices shown (3 of 4)]
[im 1/62]
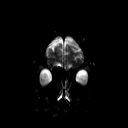
[im 21/62]
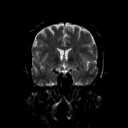
[im 41/62]
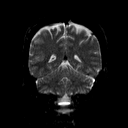
[im 62/62]
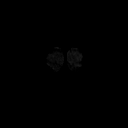

[Series 6: DWI · coronal · 5.0mm · 1.80mm/px · 2 of 32 slices shown (4 of 4)]
[im 1/32]
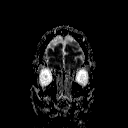
[im 32/32]
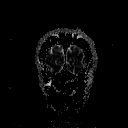

[Series 8: swi_images · axial · 2.0mm · 0.90mm/px · z∈[-26,+132]mm · 5 of 80 slices shown]
[im 1/80]
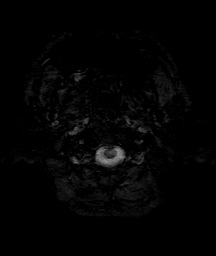
[im 20/80]
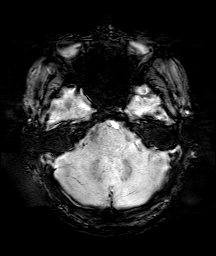
[im 40/80]
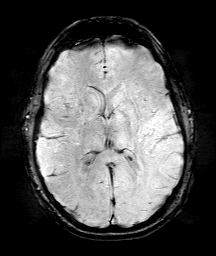
[im 60/80]
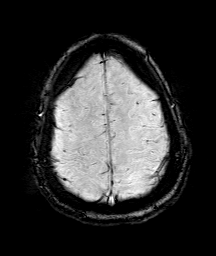
[im 80/80]
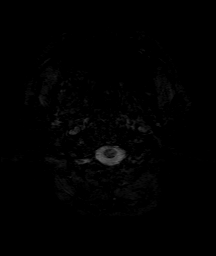

[Series 9: T2 · axial · 5.0mm · 0.51mm/px · 1 of 22 slices shown (1 of 2)]
[im 1/22]
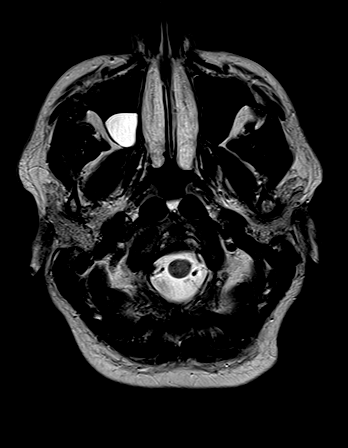

[Series 10: FLAIR · axial · 3.0mm · 0.45mm/px · z∈[-18,+125]mm · 3 of 49 slices shown (1 of 2)]
[im 1/49]
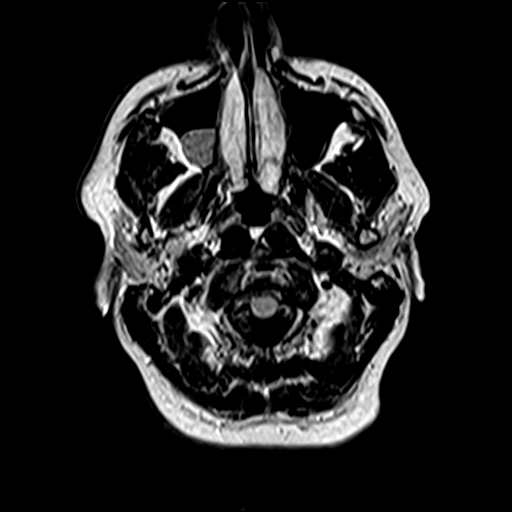
[im 25/49]
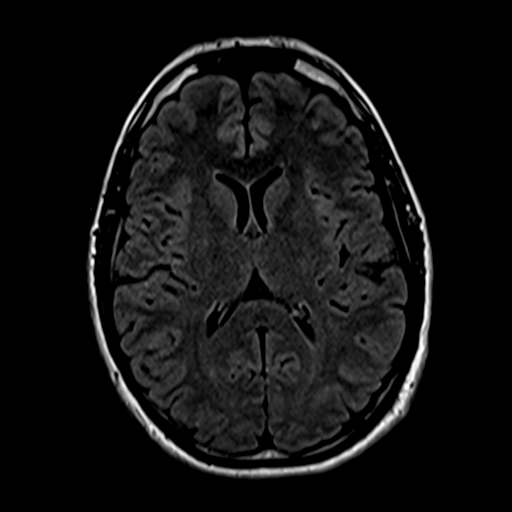
[im 49/49]
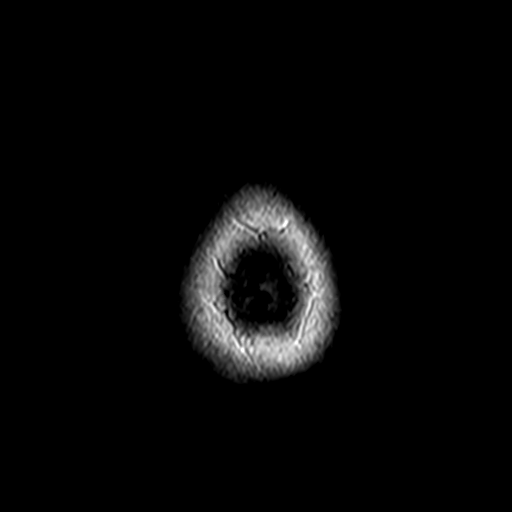

[Series 11: t1_mpr_tra · axial · 1.0mm · 0.45mm/px · z∈[-19,+124]mm · 8 of 144 slices shown (1 of 2)]
[im 1/144]
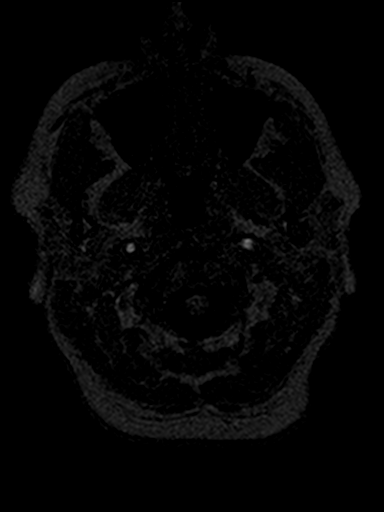
[im 18/144]
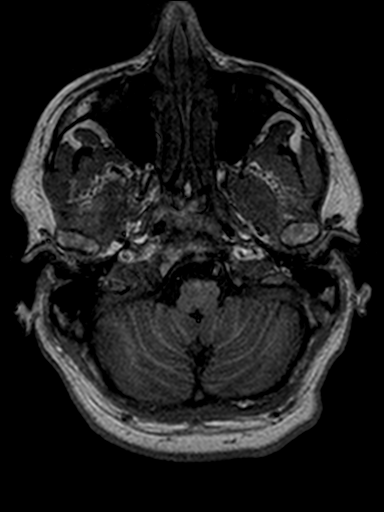
[im 36/144]
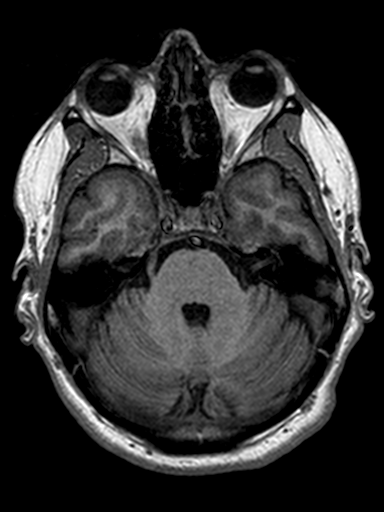
[im 54/144]
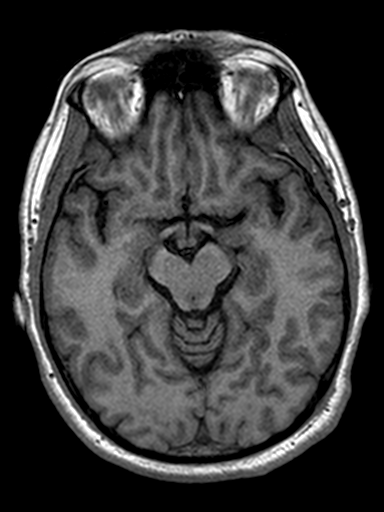
[im 90/144]
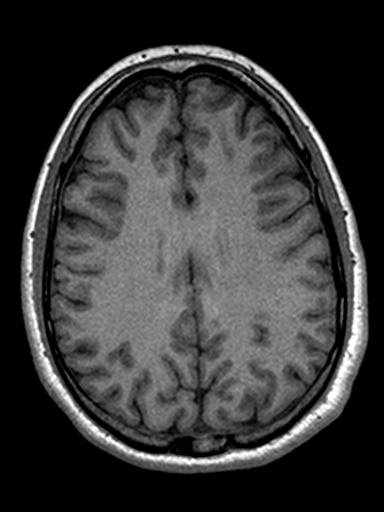
[im 108/144]
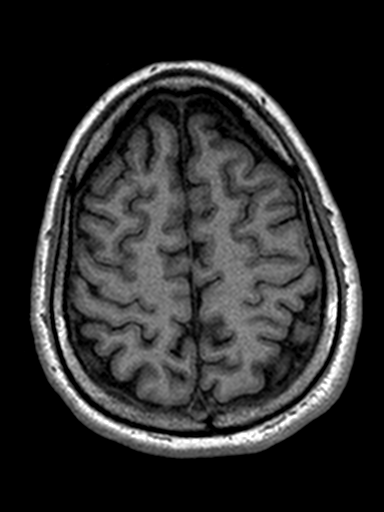
[im 126/144]
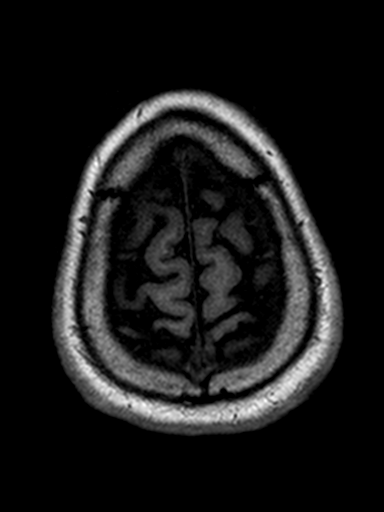
[im 144/144]
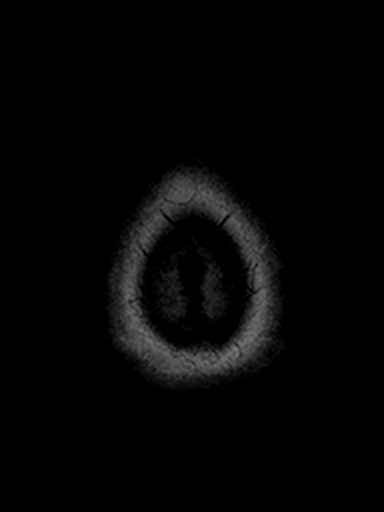

[Series 12: FLAIR · sagittal · 5.0mm · 0.45mm/px · 2 of 25 slices shown (2 of 2)]
[im 1/25]
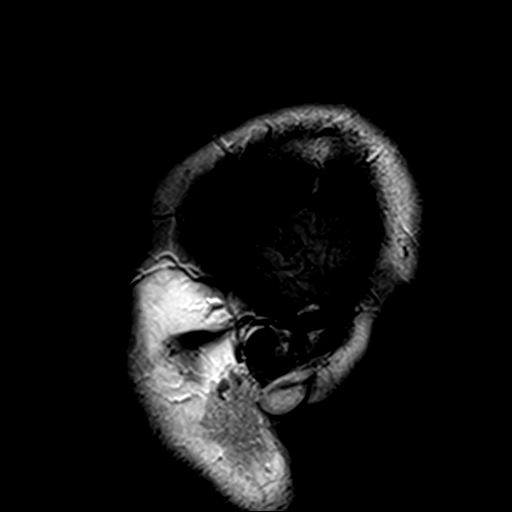
[im 25/25]
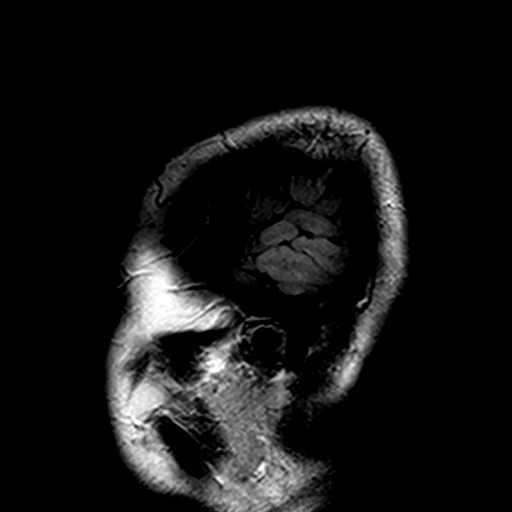

[Series 13: T2 · coronal · 5.0mm · 0.45mm/px · 2 of 25 slices shown (2 of 2)]
[im 1/25]
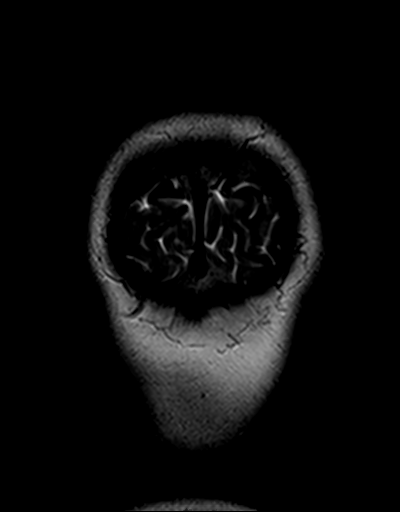
[im 25/25]
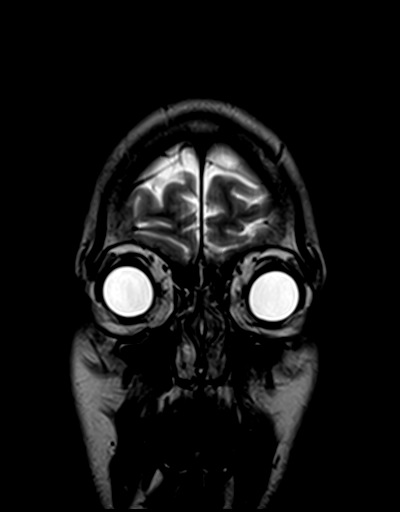

[Series 14: t1_mpr_tra · axial · 1.0mm · 0.45mm/px · z∈[-19,+16]mm · 3 of 144 slices shown (2 of 2)]
[im 1/144]
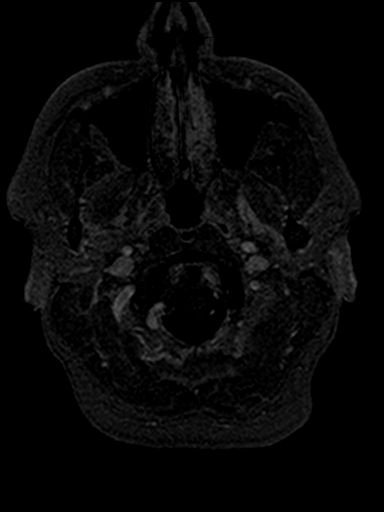
[im 18/144]
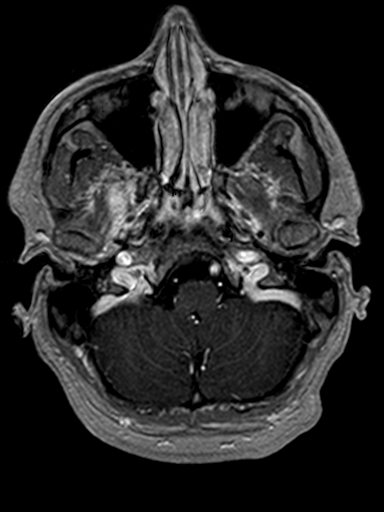
[im 36/144]
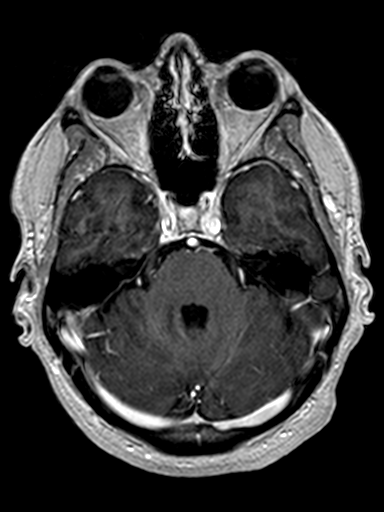

[39 of 48 positions shown; findings below may reference images not displayed]

FINDINGS: Brain: No evidence for acute infarction, hemorrhage, mass lesion,
hydrocephalus, or extra-axial fluid. Normal cerebral volume. No
white matter disease.

Post infusion, no abnormal enhancement of the brain or meninges.

Vascular: Flow voids are maintained throughout the carotid, basilar,
and vertebral arteries. There are no areas of chronic hemorrhage.
Major dural venous sinuses are patent.

Skull and upper cervical spine: Unremarkable visualized calvarium,
skullbase, and cervical vertebrae. Pituitary, pineal, cerebellar
tonsils unremarkable. No upper cervical cord lesions. Incidental 1
cm Thornwaldt cyst in the midline.

Sinuses/Orbits: Retention cyst RIGHT maxillary sinus. No layering
fluid. Negative orbits.

Other: No temporal bone inflammatory process.
IMPRESSION: Negative cranial MRI.

## 2018-08-20 DIAGNOSIS — E785 Hyperlipidemia, unspecified: Secondary | ICD-10-CM | POA: Diagnosis not present

## 2018-08-20 DIAGNOSIS — I10 Essential (primary) hypertension: Secondary | ICD-10-CM | POA: Diagnosis not present

## 2018-08-20 DIAGNOSIS — H838X9 Other specified diseases of inner ear, unspecified ear: Secondary | ICD-10-CM | POA: Diagnosis not present

## 2019-02-18 DIAGNOSIS — Z20828 Contact with and (suspected) exposure to other viral communicable diseases: Secondary | ICD-10-CM | POA: Diagnosis not present

## 2019-02-19 DIAGNOSIS — Z20828 Contact with and (suspected) exposure to other viral communicable diseases: Secondary | ICD-10-CM | POA: Diagnosis not present

## 2019-04-27 ENCOUNTER — Other Ambulatory Visit: Payer: Self-pay

## 2019-04-27 DIAGNOSIS — Z20822 Contact with and (suspected) exposure to covid-19: Secondary | ICD-10-CM

## 2019-04-28 LAB — NOVEL CORONAVIRUS, NAA: SARS-CoV-2, NAA: NOT DETECTED

## 2019-05-01 DIAGNOSIS — Z23 Encounter for immunization: Secondary | ICD-10-CM | POA: Diagnosis not present

## 2019-05-06 ENCOUNTER — Ambulatory Visit: Payer: Managed Care, Other (non HMO)

## 2019-06-24 DIAGNOSIS — L819 Disorder of pigmentation, unspecified: Secondary | ICD-10-CM | POA: Diagnosis not present

## 2019-06-24 DIAGNOSIS — D1801 Hemangioma of skin and subcutaneous tissue: Secondary | ICD-10-CM | POA: Diagnosis not present

## 2019-06-24 DIAGNOSIS — D485 Neoplasm of uncertain behavior of skin: Secondary | ICD-10-CM | POA: Diagnosis not present

## 2019-06-24 DIAGNOSIS — D229 Melanocytic nevi, unspecified: Secondary | ICD-10-CM | POA: Diagnosis not present

## 2019-06-24 DIAGNOSIS — L814 Other melanin hyperpigmentation: Secondary | ICD-10-CM | POA: Diagnosis not present

## 2019-06-25 DIAGNOSIS — D225 Melanocytic nevi of trunk: Secondary | ICD-10-CM | POA: Diagnosis not present

## 2019-07-08 DIAGNOSIS — D485 Neoplasm of uncertain behavior of skin: Secondary | ICD-10-CM | POA: Diagnosis not present

## 2019-07-10 DIAGNOSIS — D225 Melanocytic nevi of trunk: Secondary | ICD-10-CM | POA: Diagnosis not present

## 2019-07-22 DIAGNOSIS — D485 Neoplasm of uncertain behavior of skin: Secondary | ICD-10-CM | POA: Diagnosis not present

## 2019-07-22 DIAGNOSIS — L989 Disorder of the skin and subcutaneous tissue, unspecified: Secondary | ICD-10-CM | POA: Diagnosis not present

## 2019-07-22 DIAGNOSIS — L299 Pruritus, unspecified: Secondary | ICD-10-CM | POA: Diagnosis not present

## 2019-09-10 DIAGNOSIS — L308 Other specified dermatitis: Secondary | ICD-10-CM | POA: Diagnosis not present

## 2019-09-15 DIAGNOSIS — E785 Hyperlipidemia, unspecified: Secondary | ICD-10-CM | POA: Diagnosis not present

## 2019-09-15 DIAGNOSIS — I10 Essential (primary) hypertension: Secondary | ICD-10-CM | POA: Diagnosis not present

## 2019-09-15 DIAGNOSIS — R1310 Dysphagia, unspecified: Secondary | ICD-10-CM | POA: Diagnosis not present

## 2019-09-15 DIAGNOSIS — Z0000 Encounter for general adult medical examination without abnormal findings: Secondary | ICD-10-CM | POA: Diagnosis not present

## 2019-09-23 DIAGNOSIS — R1310 Dysphagia, unspecified: Secondary | ICD-10-CM | POA: Diagnosis not present

## 2019-09-28 ENCOUNTER — Other Ambulatory Visit (HOSPITAL_COMMUNITY): Payer: Self-pay | Admitting: *Deleted

## 2019-09-28 DIAGNOSIS — R1310 Dysphagia, unspecified: Secondary | ICD-10-CM

## 2019-10-05 DIAGNOSIS — Z13220 Encounter for screening for lipoid disorders: Secondary | ICD-10-CM | POA: Diagnosis not present

## 2019-10-05 DIAGNOSIS — Z0000 Encounter for general adult medical examination without abnormal findings: Secondary | ICD-10-CM | POA: Diagnosis not present

## 2019-10-05 DIAGNOSIS — R1310 Dysphagia, unspecified: Secondary | ICD-10-CM | POA: Diagnosis not present

## 2019-10-05 DIAGNOSIS — Z125 Encounter for screening for malignant neoplasm of prostate: Secondary | ICD-10-CM | POA: Diagnosis not present

## 2019-10-05 DIAGNOSIS — E785 Hyperlipidemia, unspecified: Secondary | ICD-10-CM | POA: Diagnosis not present

## 2019-10-08 ENCOUNTER — Other Ambulatory Visit: Payer: Self-pay

## 2019-10-08 ENCOUNTER — Ambulatory Visit (HOSPITAL_COMMUNITY)
Admission: RE | Admit: 2019-10-08 | Discharge: 2019-10-08 | Disposition: A | Discharge status: Home / Self Care | Payer: BC Managed Care – PPO | Source: Ambulatory Visit | Attending: Physician Assistant | Admitting: Physician Assistant

## 2019-10-08 DIAGNOSIS — R1310 Dysphagia, unspecified: Secondary | ICD-10-CM | POA: Diagnosis not present

## 2020-03-14 ENCOUNTER — Encounter: Payer: Self-pay | Admitting: Medical

## 2020-03-14 ENCOUNTER — Telehealth: Payer: Self-pay | Admitting: Medical

## 2020-03-14 ENCOUNTER — Other Ambulatory Visit: Payer: Self-pay

## 2020-03-14 ENCOUNTER — Ambulatory Visit: Payer: Self-pay | Admitting: *Deleted

## 2020-03-14 DIAGNOSIS — B349 Viral infection, unspecified: Secondary | ICD-10-CM

## 2020-03-14 DIAGNOSIS — J3489 Other specified disorders of nose and nasal sinuses: Secondary | ICD-10-CM

## 2020-03-14 DIAGNOSIS — Z20822 Contact with and (suspected) exposure to covid-19: Secondary | ICD-10-CM

## 2020-03-14 DIAGNOSIS — R0982 Postnasal drip: Secondary | ICD-10-CM

## 2020-03-14 LAB — POC COVID19 BINAXNOW: SARS Coronavirus 2 Ag: NEGATIVE

## 2020-03-14 NOTE — Progress Notes (Signed)
   Subjective:    Patient ID: Justin Rush, male    DOB: [birth year], 39 y.o.   MRN: [id number]  HPI 39 year old male in non acute distress , gives consent for telemedicine appointment.Symptoms started Satuday with headache night, next day st, head congestion.. Taken Dayquil   Allergies  Allergen Reactions  . Penicillin G Other (See Comments)    Current Outpatient Medications:  .  amLODipine (NORVASC) 5 MG tablet, Take 5 mg by mouth daily., Disp: , Rfl:  .  atorvastatin (LIPITOR) 10 MG tablet, Take 10 mg by mouth daily., Disp: , Rfl:  .  Cholecalciferol (VITAMIN D3 PO), Take 2,000 Units by mouth daily., Disp: , Rfl:  .  hydrochlorothiazide (HYDRODIURIL) 25 MG tablet, Take 25 mg by mouth daily., Disp: , Rfl:  .  lisinopril (PRINIVIL,ZESTRIL) 10 MG tablet, Take 10 mg by mouth daily., Disp: , Rfl:  .  Multiple Vitamins-Minerals (MULTIVITAMIN MEN PO), Take 1 tablet by mouth daily., Disp: , Rfl:  .  ondansetron (ZOFRAN ODT) 4 MG disintegrating tablet, Take 1 tablet (4 mg total) by mouth every 6 (six) hours as needed (Patient not taking: Reported on 08/10/2016), Disp: 30 tablet, Rfl: 6   Past Medical History:  Diagnosis Date  . High cholesterol   . Hypertension    Nonsmoker  Review of Systems  Constitutional: Negative for chills, fatigue and fever.  HENT: Positive for ear pain (right on/off mild), sinus pressure (maxillary and forehead) and sore throat. Negative for postnasal drip and rhinorrhea.   Respiratory: Positive for cough. Negative for chest tightness, shortness of breath and wheezing.   Cardiovascular: Negative for chest pain.  Gastrointestinal: Negative for abdominal pain, diarrhea, nausea and vomiting.  Musculoskeletal: Negative for myalgias.  Skin: Negative for rash.  Neurological: Positive for headaches. Negative for dizziness, syncope and light-headedness.   Covid-19 POCT negative Will send off PCR test to [facility]    Objective:   Physical Exam Neurological:      Mental Status: He is alert and oriented to person, place, and time.  Psychiatric:        Mood and Affect: Mood normal.        Behavior: Behavior normal.        Thought Content: Thought content normal.        Judgment: Judgment normal.   No cough or clearing of throat during phone call. Patient does sound like he has nasal  congestion.   Covid-19 second vaccine 11/04/19 Pfizer     Assessment & Plan:  Viral illness PND Rhinorrhea, Sent PCR specimen to [facility]. OTC Claritn or Zyrtec and  Flonase take per package instructions. Rest, increase fluids, bland foods.. Try to isolate from  Your family. Clean bathroom with Clorox after each use. May take  Tylenol 325mg  with Dayquil if fever or still not feeling well try OTC Ibuprofen 200mg  x 2 tablets =400mg  x every 6 hours if needed.  Patient is signed up for MyChart however he does not recall how to get in. Explained to go to MyChart site and to try to access chart, if he has problems he is to call the clinic. Patient verbalizes understanding and has no questions at [facility].  Covi-19d information sent to MyChart.

## 2020-03-16 LAB — NOVEL CORONAVIRUS, NAA: SARS-CoV-2, NAA: NOT DETECTED

## 2020-03-16 LAB — SARS-COV-2, NAA 2 DAY TAT

## 2020-03-16 NOTE — Progress Notes (Signed)
FYI

## 2020-03-29 ENCOUNTER — Other Ambulatory Visit: Payer: Self-pay

## 2020-03-29 ENCOUNTER — Ambulatory Visit: Payer: BC Managed Care – PPO | Admitting: Medical

## 2020-03-29 ENCOUNTER — Encounter: Payer: Self-pay | Admitting: Medical

## 2020-03-29 VITALS — BP 136/92 | HR 60 | Temp 97.5°F | Resp 16 | Wt 177.4 lb

## 2020-03-29 DIAGNOSIS — L255 Unspecified contact dermatitis due to plants, except food: Secondary | ICD-10-CM

## 2020-03-29 MED ORDER — PREDNISONE 10 MG (21) PO TBPK: ORAL_TABLET | ORAL | 0 refills | Status: AC

## 2020-03-29 MED ORDER — METHYLPREDNISOLONE SODIUM SUCC 125 MG IJ SOLR
125.0000 mg | Freq: Once | INTRAMUSCULAR | Status: AC
  Administered 2020-03-29: 125 mg via INTRAMUSCULAR

## 2020-03-29 NOTE — Progress Notes (Signed)
   Subjective: Rash on hands    Patient ID: Justin Rush, male    DOB: March 22, 1981, 39 y.o.   MRN: [id number]  HPI  39 yo male in non acute distress. Working outside Saturday, blisters on Sunday and uticarial rash on hands , forearms, left leg and left flank area.Rochele Pages OTC Benadryl 50mg  this morning and using Benadryl cream to the sites.   Blood pressure (!) 136/92, pulse 60, temperature (!) 97.5 F (36.4 C), temperature source Temporal, resp. rate 16, weight 177 lb 6.4 oz (80.5 kg), SpO2 98 %.   Allergies  Allergen Reactions  . Penicillin G Other (See Comments)     Review of Systems  Skin: Positive for rash (blisters and redness, itching).  negative other ROS     Objective:   Physical Exam Vitals and nursing note reviewed.  Constitutional:      Appearance: Normal appearance. He is normal weight.  Eyes:     Extraocular Movements: Extraocular movements intact.     Conjunctiva/sclera: Conjunctivae normal.     Pupils: Pupils are equal, round, and reactive to light.  Pulmonary:     Effort: Pulmonary effort is normal.  Musculoskeletal:        General: Normal range of motion.  Skin:    General: Skin is warm and dry.     Findings: Erythema and rash present. Rash is urticarial and vesicular.       Neurological:     General: No focal deficit present.     Mental Status: He is alert and oriented to person, place, and time.  Psychiatric:        Mood and Affect: Mood normal.        Behavior: Behavior normal.        Thought Content: Thought content normal.        Judgment: Judgment normal.   in between fingers L.>R        Assessment & Plan:  Contact dermatitis on hands bilaterally, forearms, and left leg and left flank Cool compresses, OTC Zyrtec for itching if unable to sleep may take OTC Benadryl 25-50  mg po as needed.Cool showers, reviewed s/s of infection and to call office or return for antibiotics.  Recommended Fels-Naptha soap for removing oils from the skin after  working outside. Monitored 15 min after injection. Tolerated well. Meds ordered this encounter  Medications  . methylPREDNISolone sodium succinate (SOLU-MEDROL) 125 mg/2 mL injection 125 mg  . predniSONE (STERAPRED UNI-PAK 21 TAB) 10 MG (21) TBPK tablet    Sig: Take 6 tablets by mouth tomorrow, 5 tables the next day, then one less tablet every day there after. Take with food.    Dispense:  21 tablet    Refill:  0  Return to clinic as needed. Patient verbalizes understanding and has no questions at discharge.

## 2020-04-04 ENCOUNTER — Telehealth: Payer: Self-pay | Admitting: Medical

## 2020-04-04 NOTE — Telephone Encounter (Signed)
Called patient to see how his contact dermatitis is doing , he says it is crusting up and healing. Right arm with injection doing "fine".

## 2020-04-05 NOTE — Patient Instructions (Signed)
Contact Dermatitis °Dermatitis is redness, soreness, and swelling (inflammation) of the skin. Contact dermatitis is a reaction to something that touches the skin. °There are two types of contact dermatitis: °· Irritant contact dermatitis. This happens when something bothers (irritates) your skin, like soap. °· Allergic contact dermatitis. This is caused when you are exposed to something that you are allergic to, such as poison ivy. °What are the causes? °· Common causes of irritant contact dermatitis include: °? Makeup. °? Soaps. °? Detergents. °? Bleaches. °? Acids. °? Metals, such as nickel. °· Common causes of allergic contact dermatitis include: °? Plants. °? Chemicals. °? Jewelry. °? Latex. °? Medicines. °? Preservatives in products, such as clothing. °What increases the risk? °· Having a job that exposes you to things that bother your skin. °· Having asthma or eczema. °What are the signs or symptoms? °Symptoms may happen anywhere the irritant has touched your skin. Symptoms include: °· Dry or flaky skin. °· Redness. °· Cracks. °· Itching. °· Pain or a burning feeling. °· Blisters. °· Blood or clear fluid draining from skin cracks. °With allergic contact dermatitis, swelling may occur. This may happen in places such as the eyelids, mouth, or genitals. °How is this treated? °· This condition is treated by checking for the cause of the reaction and protecting your skin. Treatment may also include: °? Steroid creams, ointments, or medicines. °? Antibiotic medicines or other ointments, if you have a skin infection. °? Lotion or medicines to help with itching. °? A bandage (dressing). °Follow these instructions at home: °Skin care °· Moisturize your skin as needed. °· Put cool cloths on your skin. °· Put a baking soda paste on your skin. Stir water into baking soda until it looks like a paste. °· Do not scratch your skin. °· Avoid having things rub up against your skin. °· Avoid the use of soaps, perfumes, and  dyes. °Medicines °· Take or apply over-the-counter and prescription medicines only as told by your doctor. °· If you were prescribed an antibiotic medicine, take or apply it as told by your doctor. Do not stop using it even if your condition starts to get better. °Bathing °· Take a bath with: °? Epsom salts. °? Baking soda. °? Colloidal oatmeal. °· Bathe less often. °· Bathe in warm water. Avoid using hot water. °Bandage care °· If you were given a bandage, change it as told by your health care provider. °· Wash your hands with soap and water before and after you change your bandage. If soap and water are not available, use hand sanitizer. °General instructions °· Avoid the things that caused your reaction. If you do not know what caused it, keep a journal. Write down: °? What you eat. °? What skin products you use. °? What you drink. °? What you wear in the area that has symptoms. This includes jewelry. °· Check the affected areas every day for signs of infection. Check for: °? More redness, swelling, or pain. °? More fluid or blood. °? Warmth. °? Pus or a bad smell. °· Keep all follow-up visits as told by your doctor. This is important. °Contact a doctor if: °· You do not get better with treatment. °· Your condition gets worse. °· You have signs of infection, such as: °? More swelling. °? Tenderness. °? More redness. °? Soreness. °? Warmth. °· You have a fever. °· You have new symptoms. °Get help right away if: °· You have a very bad headache. °· You have neck pain. °·   Your neck is stiff. °· You throw up (vomit). °· You feel very sleepy. °· You see red streaks coming from the area. °· Your bone or joint near the area hurts after the skin has healed. °· The area turns darker. °· You have trouble breathing. °Summary °· Dermatitis is redness, soreness, and swelling of the skin. °· Symptoms may occur where the irritant has touched you. °· Treatment may include medicines and skin care. °· If you do not know what caused  your reaction, keep a journal. °· Contact a doctor if your condition gets worse or you have signs of infection. °This information is not intended to replace advice given to you by your health care provider. Make sure you discuss any questions you have with your health care provider. °Document Revised: 11/12/2018 Document Reviewed: 02/05/2018 °Elsevier Patient Education © 2020 [employer] ° °

## 2020-07-09 ENCOUNTER — Encounter (HOSPITAL_COMMUNITY): Payer: Self-pay | Admitting: Emergency Medicine

## 2020-07-09 ENCOUNTER — Other Ambulatory Visit: Payer: Self-pay

## 2020-07-09 ENCOUNTER — Emergency Department (HOSPITAL_COMMUNITY)
Admission: EM | Admit: 2020-07-09 | Discharge: 2020-07-09 | Disposition: A | Discharge status: Home / Self Care | Payer: BC Managed Care – PPO | Attending: Emergency Medicine | Admitting: Emergency Medicine

## 2020-07-09 DIAGNOSIS — S0300XA Dislocation of jaw, unspecified side, initial encounter: Secondary | ICD-10-CM | POA: Insufficient documentation

## 2020-07-09 DIAGNOSIS — Z5321 Procedure and treatment not carried out due to patient leaving prior to being seen by health care provider: Secondary | ICD-10-CM | POA: Insufficient documentation

## 2020-07-09 DIAGNOSIS — S0993XA Unspecified injury of face, initial encounter: Secondary | ICD-10-CM | POA: Diagnosis not present

## 2020-07-09 DIAGNOSIS — X58XXXA Exposure to other specified factors, initial encounter: Secondary | ICD-10-CM | POA: Insufficient documentation

## 2020-07-09 DIAGNOSIS — R6884 Jaw pain: Secondary | ICD-10-CM

## 2020-07-09 NOTE — ED Notes (Signed)
Registration notified staff of pt leaving

## 2020-07-09 NOTE — ED Triage Notes (Signed)
Pt reports L jaw dislocated this morning while yawning.  C/o jaw pain.

## 2020-10-12 DIAGNOSIS — L819 Disorder of pigmentation, unspecified: Secondary | ICD-10-CM | POA: Diagnosis not present

## 2020-10-12 DIAGNOSIS — L821 Other seborrheic keratosis: Secondary | ICD-10-CM | POA: Diagnosis not present

## 2020-10-12 DIAGNOSIS — D229 Melanocytic nevi, unspecified: Secondary | ICD-10-CM | POA: Diagnosis not present

## 2020-10-12 DIAGNOSIS — L814 Other melanin hyperpigmentation: Secondary | ICD-10-CM | POA: Diagnosis not present

## 2022-04-04 ENCOUNTER — Telehealth: Payer: Commercial Managed Care - Pharmacy Benefit Manager

## 2022-04-04 NOTE — Telephone Encounter
Left message for patient return my call to discuss email request for updated imaging and follow up appt after imaging, offered email or phone call
